# Patient Record
Sex: Male | Born: 1935 | Race: Black or African American | Hispanic: No | Marital: Married | Smoking: Never smoker
Health system: Southern US, Community
[De-identification: ages and names within clinical notes are randomized; demographics above are authoritative.]

## PROBLEM LIST (undated history)

## (undated) DIAGNOSIS — M1990 Unspecified osteoarthritis, unspecified site: Secondary | ICD-10-CM

## (undated) DIAGNOSIS — N189 Chronic kidney disease, unspecified: Secondary | ICD-10-CM

## (undated) HISTORY — PX: EYE SURGERY: SHX253

---

## 2000-07-31 ENCOUNTER — Ambulatory Visit (HOSPITAL_COMMUNITY): Admission: RE | Admit: 2000-07-31 | Discharge: 2000-07-31 | Payer: Self-pay | Admitting: Pulmonary Disease

## 2000-07-31 ENCOUNTER — Encounter: Payer: Self-pay | Admitting: Pulmonary Disease

## 2001-01-12 ENCOUNTER — Encounter: Admission: RE | Admit: 2001-01-12 | Discharge: 2001-02-24 | Payer: Self-pay | Admitting: Neurology

## 2007-03-31 ENCOUNTER — Ambulatory Visit: Admission: RE | Admit: 2007-03-31 | Discharge: 2007-03-31 | Payer: Self-pay | Admitting: Pulmonary Disease

## 2015-07-09 DIAGNOSIS — I119 Hypertensive heart disease without heart failure: Secondary | ICD-10-CM | POA: Diagnosis not present

## 2015-07-09 DIAGNOSIS — M2550 Pain in unspecified joint: Secondary | ICD-10-CM | POA: Diagnosis not present

## 2015-07-09 DIAGNOSIS — M1990 Unspecified osteoarthritis, unspecified site: Secondary | ICD-10-CM | POA: Diagnosis not present

## 2015-07-09 DIAGNOSIS — R803 Bence Jones proteinuria: Secondary | ICD-10-CM | POA: Diagnosis not present

## 2015-08-14 DIAGNOSIS — Z23 Encounter for immunization: Secondary | ICD-10-CM | POA: Diagnosis not present

## 2015-09-05 DIAGNOSIS — M9903 Segmental and somatic dysfunction of lumbar region: Secondary | ICD-10-CM | POA: Diagnosis not present

## 2015-09-05 DIAGNOSIS — M5136 Other intervertebral disc degeneration, lumbar region: Secondary | ICD-10-CM | POA: Diagnosis not present

## 2015-09-05 DIAGNOSIS — M9905 Segmental and somatic dysfunction of pelvic region: Secondary | ICD-10-CM | POA: Diagnosis not present

## 2015-09-05 DIAGNOSIS — M9904 Segmental and somatic dysfunction of sacral region: Secondary | ICD-10-CM | POA: Diagnosis not present

## 2015-10-12 DIAGNOSIS — Z79899 Other long term (current) drug therapy: Secondary | ICD-10-CM | POA: Diagnosis not present

## 2015-10-12 DIAGNOSIS — Z79891 Long term (current) use of opiate analgesic: Secondary | ICD-10-CM | POA: Diagnosis not present

## 2015-11-21 DIAGNOSIS — R809 Proteinuria, unspecified: Secondary | ICD-10-CM | POA: Diagnosis not present

## 2015-11-21 DIAGNOSIS — Z79891 Long term (current) use of opiate analgesic: Secondary | ICD-10-CM | POA: Diagnosis not present

## 2016-01-08 DIAGNOSIS — E7800 Pure hypercholesterolemia, unspecified: Secondary | ICD-10-CM | POA: Diagnosis not present

## 2016-01-08 DIAGNOSIS — D638 Anemia in other chronic diseases classified elsewhere: Secondary | ICD-10-CM | POA: Diagnosis not present

## 2016-01-08 DIAGNOSIS — Z79899 Other long term (current) drug therapy: Secondary | ICD-10-CM | POA: Diagnosis not present

## 2016-01-08 DIAGNOSIS — M2550 Pain in unspecified joint: Secondary | ICD-10-CM | POA: Diagnosis not present

## 2016-01-08 DIAGNOSIS — R972 Elevated prostate specific antigen [PSA]: Secondary | ICD-10-CM | POA: Diagnosis not present

## 2016-01-08 DIAGNOSIS — M159 Polyosteoarthritis, unspecified: Secondary | ICD-10-CM | POA: Diagnosis not present

## 2016-01-08 DIAGNOSIS — R809 Proteinuria, unspecified: Secondary | ICD-10-CM | POA: Diagnosis not present

## 2016-01-08 DIAGNOSIS — Z125 Encounter for screening for malignant neoplasm of prostate: Secondary | ICD-10-CM | POA: Diagnosis not present

## 2016-01-08 DIAGNOSIS — Z79891 Long term (current) use of opiate analgesic: Secondary | ICD-10-CM | POA: Diagnosis not present

## 2016-01-08 DIAGNOSIS — N184 Chronic kidney disease, stage 4 (severe): Secondary | ICD-10-CM | POA: Diagnosis not present

## 2016-01-08 DIAGNOSIS — I119 Hypertensive heart disease without heart failure: Secondary | ICD-10-CM | POA: Diagnosis not present

## 2016-01-23 DIAGNOSIS — M5136 Other intervertebral disc degeneration, lumbar region: Secondary | ICD-10-CM | POA: Diagnosis not present

## 2016-01-23 DIAGNOSIS — M9904 Segmental and somatic dysfunction of sacral region: Secondary | ICD-10-CM | POA: Diagnosis not present

## 2016-01-23 DIAGNOSIS — M9905 Segmental and somatic dysfunction of pelvic region: Secondary | ICD-10-CM | POA: Diagnosis not present

## 2016-01-23 DIAGNOSIS — M9903 Segmental and somatic dysfunction of lumbar region: Secondary | ICD-10-CM | POA: Diagnosis not present

## 2016-02-20 DIAGNOSIS — M9902 Segmental and somatic dysfunction of thoracic region: Secondary | ICD-10-CM | POA: Diagnosis not present

## 2016-02-20 DIAGNOSIS — M5134 Other intervertebral disc degeneration, thoracic region: Secondary | ICD-10-CM | POA: Diagnosis not present

## 2016-02-20 DIAGNOSIS — M25511 Pain in right shoulder: Secondary | ICD-10-CM | POA: Diagnosis not present

## 2016-03-04 DIAGNOSIS — E7800 Pure hypercholesterolemia, unspecified: Secondary | ICD-10-CM | POA: Diagnosis not present

## 2016-03-04 DIAGNOSIS — M1990 Unspecified osteoarthritis, unspecified site: Secondary | ICD-10-CM | POA: Diagnosis not present

## 2016-03-04 DIAGNOSIS — R972 Elevated prostate specific antigen [PSA]: Secondary | ICD-10-CM | POA: Diagnosis not present

## 2016-03-04 DIAGNOSIS — Z79891 Long term (current) use of opiate analgesic: Secondary | ICD-10-CM | POA: Diagnosis not present

## 2016-03-04 DIAGNOSIS — R809 Proteinuria, unspecified: Secondary | ICD-10-CM | POA: Diagnosis not present

## 2016-03-04 DIAGNOSIS — M2550 Pain in unspecified joint: Secondary | ICD-10-CM | POA: Diagnosis not present

## 2016-03-04 DIAGNOSIS — Z79899 Other long term (current) drug therapy: Secondary | ICD-10-CM | POA: Diagnosis not present

## 2016-03-04 DIAGNOSIS — D638 Anemia in other chronic diseases classified elsewhere: Secondary | ICD-10-CM | POA: Diagnosis not present

## 2016-03-04 DIAGNOSIS — I119 Hypertensive heart disease without heart failure: Secondary | ICD-10-CM | POA: Diagnosis not present

## 2016-03-04 DIAGNOSIS — N184 Chronic kidney disease, stage 4 (severe): Secondary | ICD-10-CM | POA: Diagnosis not present

## 2016-03-19 DIAGNOSIS — M9902 Segmental and somatic dysfunction of thoracic region: Secondary | ICD-10-CM | POA: Diagnosis not present

## 2016-03-19 DIAGNOSIS — M5134 Other intervertebral disc degeneration, thoracic region: Secondary | ICD-10-CM | POA: Diagnosis not present

## 2016-03-19 DIAGNOSIS — M25511 Pain in right shoulder: Secondary | ICD-10-CM | POA: Diagnosis not present

## 2016-04-04 DIAGNOSIS — Z79899 Other long term (current) drug therapy: Secondary | ICD-10-CM | POA: Diagnosis not present

## 2016-04-04 DIAGNOSIS — R809 Proteinuria, unspecified: Secondary | ICD-10-CM | POA: Diagnosis not present

## 2016-04-04 DIAGNOSIS — E7800 Pure hypercholesterolemia, unspecified: Secondary | ICD-10-CM | POA: Diagnosis not present

## 2016-04-04 DIAGNOSIS — R972 Elevated prostate specific antigen [PSA]: Secondary | ICD-10-CM | POA: Diagnosis not present

## 2016-04-04 DIAGNOSIS — N184 Chronic kidney disease, stage 4 (severe): Secondary | ICD-10-CM | POA: Diagnosis not present

## 2016-04-04 DIAGNOSIS — Z79891 Long term (current) use of opiate analgesic: Secondary | ICD-10-CM | POA: Diagnosis not present

## 2016-04-04 DIAGNOSIS — D638 Anemia in other chronic diseases classified elsewhere: Secondary | ICD-10-CM | POA: Diagnosis not present

## 2016-04-04 DIAGNOSIS — M2550 Pain in unspecified joint: Secondary | ICD-10-CM | POA: Diagnosis not present

## 2016-04-04 DIAGNOSIS — I119 Hypertensive heart disease without heart failure: Secondary | ICD-10-CM | POA: Diagnosis not present

## 2016-04-04 DIAGNOSIS — M1990 Unspecified osteoarthritis, unspecified site: Secondary | ICD-10-CM | POA: Diagnosis not present

## 2016-04-23 DIAGNOSIS — M9903 Segmental and somatic dysfunction of lumbar region: Secondary | ICD-10-CM | POA: Diagnosis not present

## 2016-04-23 DIAGNOSIS — M9905 Segmental and somatic dysfunction of pelvic region: Secondary | ICD-10-CM | POA: Diagnosis not present

## 2016-04-23 DIAGNOSIS — M9904 Segmental and somatic dysfunction of sacral region: Secondary | ICD-10-CM | POA: Diagnosis not present

## 2016-04-23 DIAGNOSIS — M5136 Other intervertebral disc degeneration, lumbar region: Secondary | ICD-10-CM | POA: Diagnosis not present

## 2016-05-21 DIAGNOSIS — M9904 Segmental and somatic dysfunction of sacral region: Secondary | ICD-10-CM | POA: Diagnosis not present

## 2016-05-21 DIAGNOSIS — M9903 Segmental and somatic dysfunction of lumbar region: Secondary | ICD-10-CM | POA: Diagnosis not present

## 2016-05-21 DIAGNOSIS — M5136 Other intervertebral disc degeneration, lumbar region: Secondary | ICD-10-CM | POA: Diagnosis not present

## 2016-05-21 DIAGNOSIS — M9905 Segmental and somatic dysfunction of pelvic region: Secondary | ICD-10-CM | POA: Diagnosis not present

## 2016-06-06 DIAGNOSIS — Z79891 Long term (current) use of opiate analgesic: Secondary | ICD-10-CM | POA: Diagnosis not present

## 2016-06-06 DIAGNOSIS — Z79899 Other long term (current) drug therapy: Secondary | ICD-10-CM | POA: Diagnosis not present

## 2016-06-06 DIAGNOSIS — M2550 Pain in unspecified joint: Secondary | ICD-10-CM | POA: Diagnosis not present

## 2016-06-06 DIAGNOSIS — N184 Chronic kidney disease, stage 4 (severe): Secondary | ICD-10-CM | POA: Diagnosis not present

## 2016-06-06 DIAGNOSIS — I119 Hypertensive heart disease without heart failure: Secondary | ICD-10-CM | POA: Diagnosis not present

## 2016-06-06 DIAGNOSIS — R809 Proteinuria, unspecified: Secondary | ICD-10-CM | POA: Diagnosis not present

## 2016-06-06 DIAGNOSIS — D638 Anemia in other chronic diseases classified elsewhere: Secondary | ICD-10-CM | POA: Diagnosis not present

## 2016-06-06 DIAGNOSIS — M1990 Unspecified osteoarthritis, unspecified site: Secondary | ICD-10-CM | POA: Diagnosis not present

## 2016-06-06 DIAGNOSIS — E7800 Pure hypercholesterolemia, unspecified: Secondary | ICD-10-CM | POA: Diagnosis not present

## 2016-06-06 DIAGNOSIS — Z5181 Encounter for therapeutic drug level monitoring: Secondary | ICD-10-CM | POA: Diagnosis not present

## 2016-06-06 DIAGNOSIS — R972 Elevated prostate specific antigen [PSA]: Secondary | ICD-10-CM | POA: Diagnosis not present

## 2016-06-19 DIAGNOSIS — M9904 Segmental and somatic dysfunction of sacral region: Secondary | ICD-10-CM | POA: Diagnosis not present

## 2016-06-19 DIAGNOSIS — M9902 Segmental and somatic dysfunction of thoracic region: Secondary | ICD-10-CM | POA: Diagnosis not present

## 2016-06-19 DIAGNOSIS — M5136 Other intervertebral disc degeneration, lumbar region: Secondary | ICD-10-CM | POA: Diagnosis not present

## 2016-06-19 DIAGNOSIS — M9903 Segmental and somatic dysfunction of lumbar region: Secondary | ICD-10-CM | POA: Diagnosis not present

## 2016-07-16 DIAGNOSIS — M9904 Segmental and somatic dysfunction of sacral region: Secondary | ICD-10-CM | POA: Diagnosis not present

## 2016-07-16 DIAGNOSIS — M9902 Segmental and somatic dysfunction of thoracic region: Secondary | ICD-10-CM | POA: Diagnosis not present

## 2016-07-16 DIAGNOSIS — M5136 Other intervertebral disc degeneration, lumbar region: Secondary | ICD-10-CM | POA: Diagnosis not present

## 2016-07-16 DIAGNOSIS — M9903 Segmental and somatic dysfunction of lumbar region: Secondary | ICD-10-CM | POA: Diagnosis not present

## 2016-08-08 DIAGNOSIS — M159 Polyosteoarthritis, unspecified: Secondary | ICD-10-CM | POA: Diagnosis not present

## 2016-08-08 DIAGNOSIS — N184 Chronic kidney disease, stage 4 (severe): Secondary | ICD-10-CM | POA: Diagnosis not present

## 2016-08-08 DIAGNOSIS — M2550 Pain in unspecified joint: Secondary | ICD-10-CM | POA: Diagnosis not present

## 2016-08-08 DIAGNOSIS — I119 Hypertensive heart disease without heart failure: Secondary | ICD-10-CM | POA: Diagnosis not present

## 2016-08-08 DIAGNOSIS — E7800 Pure hypercholesterolemia, unspecified: Secondary | ICD-10-CM | POA: Diagnosis not present

## 2016-08-08 DIAGNOSIS — G8929 Other chronic pain: Secondary | ICD-10-CM | POA: Diagnosis not present

## 2016-08-08 DIAGNOSIS — Z79891 Long term (current) use of opiate analgesic: Secondary | ICD-10-CM | POA: Diagnosis not present

## 2016-08-08 DIAGNOSIS — Z23 Encounter for immunization: Secondary | ICD-10-CM | POA: Diagnosis not present

## 2016-08-08 DIAGNOSIS — R972 Elevated prostate specific antigen [PSA]: Secondary | ICD-10-CM | POA: Diagnosis not present

## 2016-08-08 DIAGNOSIS — D638 Anemia in other chronic diseases classified elsewhere: Secondary | ICD-10-CM | POA: Diagnosis not present

## 2016-08-08 DIAGNOSIS — Z79899 Other long term (current) drug therapy: Secondary | ICD-10-CM | POA: Diagnosis not present

## 2016-08-08 DIAGNOSIS — R809 Proteinuria, unspecified: Secondary | ICD-10-CM | POA: Diagnosis not present

## 2016-08-12 DIAGNOSIS — M9903 Segmental and somatic dysfunction of lumbar region: Secondary | ICD-10-CM | POA: Diagnosis not present

## 2016-08-12 DIAGNOSIS — M5136 Other intervertebral disc degeneration, lumbar region: Secondary | ICD-10-CM | POA: Diagnosis not present

## 2016-09-17 DIAGNOSIS — M5136 Other intervertebral disc degeneration, lumbar region: Secondary | ICD-10-CM | POA: Diagnosis not present

## 2016-09-17 DIAGNOSIS — M9903 Segmental and somatic dysfunction of lumbar region: Secondary | ICD-10-CM | POA: Diagnosis not present

## 2016-10-08 DIAGNOSIS — D638 Anemia in other chronic diseases classified elsewhere: Secondary | ICD-10-CM | POA: Diagnosis not present

## 2016-10-08 DIAGNOSIS — E7800 Pure hypercholesterolemia, unspecified: Secondary | ICD-10-CM | POA: Diagnosis not present

## 2016-10-08 DIAGNOSIS — R972 Elevated prostate specific antigen [PSA]: Secondary | ICD-10-CM | POA: Diagnosis not present

## 2016-10-08 DIAGNOSIS — M2550 Pain in unspecified joint: Secondary | ICD-10-CM | POA: Diagnosis not present

## 2016-10-08 DIAGNOSIS — Z79899 Other long term (current) drug therapy: Secondary | ICD-10-CM | POA: Diagnosis not present

## 2016-10-08 DIAGNOSIS — I119 Hypertensive heart disease without heart failure: Secondary | ICD-10-CM | POA: Diagnosis not present

## 2016-10-08 DIAGNOSIS — N184 Chronic kidney disease, stage 4 (severe): Secondary | ICD-10-CM | POA: Diagnosis not present

## 2016-10-08 DIAGNOSIS — Z79891 Long term (current) use of opiate analgesic: Secondary | ICD-10-CM | POA: Diagnosis not present

## 2016-10-08 DIAGNOSIS — R809 Proteinuria, unspecified: Secondary | ICD-10-CM | POA: Diagnosis not present

## 2016-10-08 DIAGNOSIS — M159 Polyosteoarthritis, unspecified: Secondary | ICD-10-CM | POA: Diagnosis not present

## 2016-10-15 DIAGNOSIS — M5136 Other intervertebral disc degeneration, lumbar region: Secondary | ICD-10-CM | POA: Diagnosis not present

## 2016-10-15 DIAGNOSIS — M9903 Segmental and somatic dysfunction of lumbar region: Secondary | ICD-10-CM | POA: Diagnosis not present

## 2016-11-15 DIAGNOSIS — M9904 Segmental and somatic dysfunction of sacral region: Secondary | ICD-10-CM | POA: Diagnosis not present

## 2016-11-15 DIAGNOSIS — M9903 Segmental and somatic dysfunction of lumbar region: Secondary | ICD-10-CM | POA: Diagnosis not present

## 2016-11-15 DIAGNOSIS — M5136 Other intervertebral disc degeneration, lumbar region: Secondary | ICD-10-CM | POA: Diagnosis not present

## 2016-11-15 DIAGNOSIS — M9905 Segmental and somatic dysfunction of pelvic region: Secondary | ICD-10-CM | POA: Diagnosis not present

## 2016-12-03 DIAGNOSIS — M2550 Pain in unspecified joint: Secondary | ICD-10-CM | POA: Diagnosis not present

## 2016-12-03 DIAGNOSIS — M1990 Unspecified osteoarthritis, unspecified site: Secondary | ICD-10-CM | POA: Diagnosis not present

## 2016-12-03 DIAGNOSIS — Z79891 Long term (current) use of opiate analgesic: Secondary | ICD-10-CM | POA: Diagnosis not present

## 2016-12-03 DIAGNOSIS — Z79899 Other long term (current) drug therapy: Secondary | ICD-10-CM | POA: Diagnosis not present

## 2016-12-03 DIAGNOSIS — R972 Elevated prostate specific antigen [PSA]: Secondary | ICD-10-CM | POA: Diagnosis not present

## 2016-12-03 DIAGNOSIS — R809 Proteinuria, unspecified: Secondary | ICD-10-CM | POA: Diagnosis not present

## 2016-12-03 DIAGNOSIS — N184 Chronic kidney disease, stage 4 (severe): Secondary | ICD-10-CM | POA: Diagnosis not present

## 2016-12-03 DIAGNOSIS — I2510 Atherosclerotic heart disease of native coronary artery without angina pectoris: Secondary | ICD-10-CM | POA: Diagnosis not present

## 2016-12-03 DIAGNOSIS — E7800 Pure hypercholesterolemia, unspecified: Secondary | ICD-10-CM | POA: Diagnosis not present

## 2016-12-03 DIAGNOSIS — I119 Hypertensive heart disease without heart failure: Secondary | ICD-10-CM | POA: Diagnosis not present

## 2016-12-03 DIAGNOSIS — M545 Low back pain: Secondary | ICD-10-CM | POA: Diagnosis not present

## 2016-12-05 ENCOUNTER — Other Ambulatory Visit (HOSPITAL_COMMUNITY): Payer: Self-pay | Admitting: Pulmonary Disease

## 2016-12-05 ENCOUNTER — Ambulatory Visit (HOSPITAL_COMMUNITY)
Admission: RE | Admit: 2016-12-05 | Discharge: 2016-12-05 | Disposition: A | Discharge status: Home / Self Care | Payer: Medicare Other | Source: Ambulatory Visit | Attending: Pulmonary Disease | Admitting: Pulmonary Disease

## 2016-12-05 DIAGNOSIS — M4316 Spondylolisthesis, lumbar region: Secondary | ICD-10-CM | POA: Diagnosis not present

## 2016-12-05 DIAGNOSIS — M545 Low back pain: Secondary | ICD-10-CM

## 2016-12-05 DIAGNOSIS — M5136 Other intervertebral disc degeneration, lumbar region: Secondary | ICD-10-CM | POA: Insufficient documentation

## 2016-12-17 DIAGNOSIS — M9903 Segmental and somatic dysfunction of lumbar region: Secondary | ICD-10-CM | POA: Diagnosis not present

## 2016-12-17 DIAGNOSIS — M5136 Other intervertebral disc degeneration, lumbar region: Secondary | ICD-10-CM | POA: Diagnosis not present

## 2016-12-17 DIAGNOSIS — M9905 Segmental and somatic dysfunction of pelvic region: Secondary | ICD-10-CM | POA: Diagnosis not present

## 2016-12-17 DIAGNOSIS — M9904 Segmental and somatic dysfunction of sacral region: Secondary | ICD-10-CM | POA: Diagnosis not present

## 2017-01-14 DIAGNOSIS — R972 Elevated prostate specific antigen [PSA]: Secondary | ICD-10-CM | POA: Diagnosis not present

## 2017-01-14 DIAGNOSIS — Z79899 Other long term (current) drug therapy: Secondary | ICD-10-CM | POA: Diagnosis not present

## 2017-01-14 DIAGNOSIS — R809 Proteinuria, unspecified: Secondary | ICD-10-CM | POA: Diagnosis not present

## 2017-01-14 DIAGNOSIS — N184 Chronic kidney disease, stage 4 (severe): Secondary | ICD-10-CM | POA: Diagnosis not present

## 2017-01-14 DIAGNOSIS — M158 Other polyosteoarthritis: Secondary | ICD-10-CM | POA: Diagnosis not present

## 2017-01-14 DIAGNOSIS — I119 Hypertensive heart disease without heart failure: Secondary | ICD-10-CM | POA: Diagnosis not present

## 2017-01-14 DIAGNOSIS — M545 Low back pain: Secondary | ICD-10-CM | POA: Diagnosis not present

## 2017-01-14 DIAGNOSIS — D638 Anemia in other chronic diseases classified elsewhere: Secondary | ICD-10-CM | POA: Diagnosis not present

## 2017-01-14 DIAGNOSIS — E7800 Pure hypercholesterolemia, unspecified: Secondary | ICD-10-CM | POA: Diagnosis not present

## 2017-01-14 DIAGNOSIS — M2550 Pain in unspecified joint: Secondary | ICD-10-CM | POA: Diagnosis not present

## 2017-01-14 DIAGNOSIS — G8929 Other chronic pain: Secondary | ICD-10-CM | POA: Diagnosis not present

## 2017-01-14 DIAGNOSIS — Z79891 Long term (current) use of opiate analgesic: Secondary | ICD-10-CM | POA: Diagnosis not present

## 2017-01-15 DIAGNOSIS — M5136 Other intervertebral disc degeneration, lumbar region: Secondary | ICD-10-CM | POA: Diagnosis not present

## 2017-01-15 DIAGNOSIS — M9903 Segmental and somatic dysfunction of lumbar region: Secondary | ICD-10-CM | POA: Diagnosis not present

## 2017-01-15 DIAGNOSIS — M9904 Segmental and somatic dysfunction of sacral region: Secondary | ICD-10-CM | POA: Diagnosis not present

## 2017-01-15 DIAGNOSIS — M9905 Segmental and somatic dysfunction of pelvic region: Secondary | ICD-10-CM | POA: Diagnosis not present

## 2017-02-11 DIAGNOSIS — M9905 Segmental and somatic dysfunction of pelvic region: Secondary | ICD-10-CM | POA: Diagnosis not present

## 2017-02-11 DIAGNOSIS — M9904 Segmental and somatic dysfunction of sacral region: Secondary | ICD-10-CM | POA: Diagnosis not present

## 2017-02-11 DIAGNOSIS — M5136 Other intervertebral disc degeneration, lumbar region: Secondary | ICD-10-CM | POA: Diagnosis not present

## 2017-02-11 DIAGNOSIS — M9903 Segmental and somatic dysfunction of lumbar region: Secondary | ICD-10-CM | POA: Diagnosis not present

## 2017-03-11 DIAGNOSIS — M9903 Segmental and somatic dysfunction of lumbar region: Secondary | ICD-10-CM | POA: Diagnosis not present

## 2017-03-11 DIAGNOSIS — M9905 Segmental and somatic dysfunction of pelvic region: Secondary | ICD-10-CM | POA: Diagnosis not present

## 2017-03-11 DIAGNOSIS — M9904 Segmental and somatic dysfunction of sacral region: Secondary | ICD-10-CM | POA: Diagnosis not present

## 2017-03-11 DIAGNOSIS — M5136 Other intervertebral disc degeneration, lumbar region: Secondary | ICD-10-CM | POA: Diagnosis not present

## 2017-03-20 DIAGNOSIS — E7800 Pure hypercholesterolemia, unspecified: Secondary | ICD-10-CM | POA: Diagnosis not present

## 2017-03-20 DIAGNOSIS — N184 Chronic kidney disease, stage 4 (severe): Secondary | ICD-10-CM | POA: Diagnosis not present

## 2017-03-20 DIAGNOSIS — I119 Hypertensive heart disease without heart failure: Secondary | ICD-10-CM | POA: Diagnosis not present

## 2017-03-20 DIAGNOSIS — D638 Anemia in other chronic diseases classified elsewhere: Secondary | ICD-10-CM | POA: Diagnosis not present

## 2017-03-20 DIAGNOSIS — R972 Elevated prostate specific antigen [PSA]: Secondary | ICD-10-CM | POA: Diagnosis not present

## 2017-03-20 DIAGNOSIS — Z79891 Long term (current) use of opiate analgesic: Secondary | ICD-10-CM | POA: Diagnosis not present

## 2017-03-20 DIAGNOSIS — M159 Polyosteoarthritis, unspecified: Secondary | ICD-10-CM | POA: Diagnosis not present

## 2017-03-20 DIAGNOSIS — Z79899 Other long term (current) drug therapy: Secondary | ICD-10-CM | POA: Diagnosis not present

## 2017-03-20 DIAGNOSIS — M545 Low back pain: Secondary | ICD-10-CM | POA: Diagnosis not present

## 2017-03-20 DIAGNOSIS — R809 Proteinuria, unspecified: Secondary | ICD-10-CM | POA: Diagnosis not present

## 2017-03-20 DIAGNOSIS — M2550 Pain in unspecified joint: Secondary | ICD-10-CM | POA: Diagnosis not present

## 2017-04-15 DIAGNOSIS — M9903 Segmental and somatic dysfunction of lumbar region: Secondary | ICD-10-CM | POA: Diagnosis not present

## 2017-04-15 DIAGNOSIS — M9905 Segmental and somatic dysfunction of pelvic region: Secondary | ICD-10-CM | POA: Diagnosis not present

## 2017-04-15 DIAGNOSIS — M9904 Segmental and somatic dysfunction of sacral region: Secondary | ICD-10-CM | POA: Diagnosis not present

## 2017-04-15 DIAGNOSIS — M5136 Other intervertebral disc degeneration, lumbar region: Secondary | ICD-10-CM | POA: Diagnosis not present

## 2017-05-13 DIAGNOSIS — M9904 Segmental and somatic dysfunction of sacral region: Secondary | ICD-10-CM | POA: Diagnosis not present

## 2017-05-13 DIAGNOSIS — M9903 Segmental and somatic dysfunction of lumbar region: Secondary | ICD-10-CM | POA: Diagnosis not present

## 2017-05-13 DIAGNOSIS — M5136 Other intervertebral disc degeneration, lumbar region: Secondary | ICD-10-CM | POA: Diagnosis not present

## 2017-05-13 DIAGNOSIS — M9905 Segmental and somatic dysfunction of pelvic region: Secondary | ICD-10-CM | POA: Diagnosis not present

## 2017-05-22 DIAGNOSIS — M545 Low back pain: Secondary | ICD-10-CM | POA: Diagnosis not present

## 2017-05-22 DIAGNOSIS — Z125 Encounter for screening for malignant neoplasm of prostate: Secondary | ICD-10-CM | POA: Diagnosis not present

## 2017-05-22 DIAGNOSIS — N184 Chronic kidney disease, stage 4 (severe): Secondary | ICD-10-CM | POA: Diagnosis not present

## 2017-05-22 DIAGNOSIS — M2550 Pain in unspecified joint: Secondary | ICD-10-CM | POA: Diagnosis not present

## 2017-05-22 DIAGNOSIS — E7800 Pure hypercholesterolemia, unspecified: Secondary | ICD-10-CM | POA: Diagnosis not present

## 2017-05-22 DIAGNOSIS — I119 Hypertensive heart disease without heart failure: Secondary | ICD-10-CM | POA: Diagnosis not present

## 2017-05-22 DIAGNOSIS — M455 Ankylosing spondylitis of thoracolumbar region: Secondary | ICD-10-CM | POA: Diagnosis not present

## 2017-05-22 DIAGNOSIS — R972 Elevated prostate specific antigen [PSA]: Secondary | ICD-10-CM | POA: Diagnosis not present

## 2017-05-22 DIAGNOSIS — Z79899 Other long term (current) drug therapy: Secondary | ICD-10-CM | POA: Diagnosis not present

## 2017-05-22 DIAGNOSIS — D638 Anemia in other chronic diseases classified elsewhere: Secondary | ICD-10-CM | POA: Diagnosis not present

## 2017-05-22 DIAGNOSIS — R809 Proteinuria, unspecified: Secondary | ICD-10-CM | POA: Diagnosis not present

## 2017-05-22 DIAGNOSIS — Z79891 Long term (current) use of opiate analgesic: Secondary | ICD-10-CM | POA: Diagnosis not present

## 2017-05-22 DIAGNOSIS — M158 Other polyosteoarthritis: Secondary | ICD-10-CM | POA: Diagnosis not present

## 2017-06-17 DIAGNOSIS — M5136 Other intervertebral disc degeneration, lumbar region: Secondary | ICD-10-CM | POA: Diagnosis not present

## 2017-06-17 DIAGNOSIS — M9903 Segmental and somatic dysfunction of lumbar region: Secondary | ICD-10-CM | POA: Diagnosis not present

## 2017-06-17 DIAGNOSIS — M9904 Segmental and somatic dysfunction of sacral region: Secondary | ICD-10-CM | POA: Diagnosis not present

## 2017-06-17 DIAGNOSIS — M9905 Segmental and somatic dysfunction of pelvic region: Secondary | ICD-10-CM | POA: Diagnosis not present

## 2017-07-22 DIAGNOSIS — M9904 Segmental and somatic dysfunction of sacral region: Secondary | ICD-10-CM | POA: Diagnosis not present

## 2017-07-22 DIAGNOSIS — M9903 Segmental and somatic dysfunction of lumbar region: Secondary | ICD-10-CM | POA: Diagnosis not present

## 2017-07-22 DIAGNOSIS — M9905 Segmental and somatic dysfunction of pelvic region: Secondary | ICD-10-CM | POA: Diagnosis not present

## 2017-07-22 DIAGNOSIS — M5136 Other intervertebral disc degeneration, lumbar region: Secondary | ICD-10-CM | POA: Diagnosis not present

## 2017-07-24 DIAGNOSIS — Z79891 Long term (current) use of opiate analgesic: Secondary | ICD-10-CM | POA: Diagnosis not present

## 2017-07-24 DIAGNOSIS — N184 Chronic kidney disease, stage 4 (severe): Secondary | ICD-10-CM | POA: Diagnosis not present

## 2017-07-24 DIAGNOSIS — D638 Anemia in other chronic diseases classified elsewhere: Secondary | ICD-10-CM | POA: Diagnosis not present

## 2017-07-24 DIAGNOSIS — M545 Low back pain: Secondary | ICD-10-CM | POA: Diagnosis not present

## 2017-07-24 DIAGNOSIS — R972 Elevated prostate specific antigen [PSA]: Secondary | ICD-10-CM | POA: Diagnosis not present

## 2017-07-24 DIAGNOSIS — Z23 Encounter for immunization: Secondary | ICD-10-CM | POA: Diagnosis not present

## 2017-07-24 DIAGNOSIS — M2550 Pain in unspecified joint: Secondary | ICD-10-CM | POA: Diagnosis not present

## 2017-07-24 DIAGNOSIS — E7800 Pure hypercholesterolemia, unspecified: Secondary | ICD-10-CM | POA: Diagnosis not present

## 2017-07-24 DIAGNOSIS — M159 Polyosteoarthritis, unspecified: Secondary | ICD-10-CM | POA: Diagnosis not present

## 2017-07-24 DIAGNOSIS — Z79899 Other long term (current) drug therapy: Secondary | ICD-10-CM | POA: Diagnosis not present

## 2017-07-24 DIAGNOSIS — R809 Proteinuria, unspecified: Secondary | ICD-10-CM | POA: Diagnosis not present

## 2017-07-24 DIAGNOSIS — I119 Hypertensive heart disease without heart failure: Secondary | ICD-10-CM | POA: Diagnosis not present

## 2017-08-20 DIAGNOSIS — M9905 Segmental and somatic dysfunction of pelvic region: Secondary | ICD-10-CM | POA: Diagnosis not present

## 2017-08-20 DIAGNOSIS — M9903 Segmental and somatic dysfunction of lumbar region: Secondary | ICD-10-CM | POA: Diagnosis not present

## 2017-08-20 DIAGNOSIS — M5136 Other intervertebral disc degeneration, lumbar region: Secondary | ICD-10-CM | POA: Diagnosis not present

## 2017-08-20 DIAGNOSIS — M9904 Segmental and somatic dysfunction of sacral region: Secondary | ICD-10-CM | POA: Diagnosis not present

## 2017-09-11 DIAGNOSIS — N184 Chronic kidney disease, stage 4 (severe): Secondary | ICD-10-CM | POA: Diagnosis not present

## 2017-09-11 DIAGNOSIS — E7800 Pure hypercholesterolemia, unspecified: Secondary | ICD-10-CM | POA: Diagnosis not present

## 2017-09-11 DIAGNOSIS — M545 Low back pain: Secondary | ICD-10-CM | POA: Diagnosis not present

## 2017-09-11 DIAGNOSIS — Z79891 Long term (current) use of opiate analgesic: Secondary | ICD-10-CM | POA: Diagnosis not present

## 2017-09-11 DIAGNOSIS — Z79899 Other long term (current) drug therapy: Secondary | ICD-10-CM | POA: Diagnosis not present

## 2017-09-11 DIAGNOSIS — R972 Elevated prostate specific antigen [PSA]: Secondary | ICD-10-CM | POA: Diagnosis not present

## 2017-09-11 DIAGNOSIS — D638 Anemia in other chronic diseases classified elsewhere: Secondary | ICD-10-CM | POA: Diagnosis not present

## 2017-09-11 DIAGNOSIS — M130 Polyarthritis, unspecified: Secondary | ICD-10-CM | POA: Diagnosis not present

## 2017-09-11 DIAGNOSIS — M2550 Pain in unspecified joint: Secondary | ICD-10-CM | POA: Diagnosis not present

## 2017-09-11 DIAGNOSIS — R809 Proteinuria, unspecified: Secondary | ICD-10-CM | POA: Diagnosis not present

## 2017-09-11 DIAGNOSIS — I119 Hypertensive heart disease without heart failure: Secondary | ICD-10-CM | POA: Diagnosis not present

## 2017-09-16 DIAGNOSIS — M9903 Segmental and somatic dysfunction of lumbar region: Secondary | ICD-10-CM | POA: Diagnosis not present

## 2017-09-16 DIAGNOSIS — M5136 Other intervertebral disc degeneration, lumbar region: Secondary | ICD-10-CM | POA: Diagnosis not present

## 2017-09-16 DIAGNOSIS — M9905 Segmental and somatic dysfunction of pelvic region: Secondary | ICD-10-CM | POA: Diagnosis not present

## 2017-09-16 DIAGNOSIS — M9904 Segmental and somatic dysfunction of sacral region: Secondary | ICD-10-CM | POA: Diagnosis not present

## 2017-11-20 DIAGNOSIS — I119 Hypertensive heart disease without heart failure: Secondary | ICD-10-CM | POA: Diagnosis not present

## 2017-11-20 DIAGNOSIS — M5440 Lumbago with sciatica, unspecified side: Secondary | ICD-10-CM | POA: Diagnosis not present

## 2017-11-20 DIAGNOSIS — Z79891 Long term (current) use of opiate analgesic: Secondary | ICD-10-CM | POA: Diagnosis not present

## 2017-11-20 DIAGNOSIS — Z79899 Other long term (current) drug therapy: Secondary | ICD-10-CM | POA: Diagnosis not present

## 2017-11-20 DIAGNOSIS — M2550 Pain in unspecified joint: Secondary | ICD-10-CM | POA: Diagnosis not present

## 2017-11-20 DIAGNOSIS — N184 Chronic kidney disease, stage 4 (severe): Secondary | ICD-10-CM | POA: Diagnosis not present

## 2017-11-20 DIAGNOSIS — E7800 Pure hypercholesterolemia, unspecified: Secondary | ICD-10-CM | POA: Diagnosis not present

## 2017-11-20 DIAGNOSIS — R972 Elevated prostate specific antigen [PSA]: Secondary | ICD-10-CM | POA: Diagnosis not present

## 2017-11-20 DIAGNOSIS — E790 Hyperuricemia without signs of inflammatory arthritis and tophaceous disease: Secondary | ICD-10-CM | POA: Diagnosis not present

## 2017-11-20 DIAGNOSIS — R809 Proteinuria, unspecified: Secondary | ICD-10-CM | POA: Diagnosis not present

## 2017-11-20 DIAGNOSIS — D638 Anemia in other chronic diseases classified elsewhere: Secondary | ICD-10-CM | POA: Diagnosis not present

## 2017-11-20 DIAGNOSIS — M159 Polyosteoarthritis, unspecified: Secondary | ICD-10-CM | POA: Diagnosis not present

## 2018-09-18 ENCOUNTER — Encounter (HOSPITAL_COMMUNITY): Payer: Self-pay | Admitting: Emergency Medicine

## 2018-09-18 ENCOUNTER — Inpatient Hospital Stay (HOSPITAL_COMMUNITY)
Admission: EM | Admit: 2018-09-18 | Discharge: 2018-09-23 | DRG: 377 | Disposition: A | Discharge status: Hospice | Payer: Medicare Other | Attending: Internal Medicine | Admitting: Internal Medicine

## 2018-09-18 ENCOUNTER — Other Ambulatory Visit: Payer: Self-pay

## 2018-09-18 ENCOUNTER — Emergency Department (HOSPITAL_COMMUNITY): Payer: Medicare Other

## 2018-09-18 DIAGNOSIS — I120 Hypertensive chronic kidney disease with stage 5 chronic kidney disease or end stage renal disease: Secondary | ICD-10-CM | POA: Diagnosis present

## 2018-09-18 DIAGNOSIS — K5731 Diverticulosis of large intestine without perforation or abscess with bleeding: Secondary | ICD-10-CM | POA: Diagnosis present

## 2018-09-18 DIAGNOSIS — N179 Acute kidney failure, unspecified: Secondary | ICD-10-CM

## 2018-09-18 DIAGNOSIS — D62 Acute posthemorrhagic anemia: Secondary | ICD-10-CM | POA: Diagnosis present

## 2018-09-18 DIAGNOSIS — N189 Chronic kidney disease, unspecified: Secondary | ICD-10-CM

## 2018-09-18 DIAGNOSIS — F419 Anxiety disorder, unspecified: Secondary | ICD-10-CM | POA: Diagnosis present

## 2018-09-18 DIAGNOSIS — M109 Gout, unspecified: Secondary | ICD-10-CM | POA: Diagnosis present

## 2018-09-18 DIAGNOSIS — Z515 Encounter for palliative care: Secondary | ICD-10-CM | POA: Diagnosis present

## 2018-09-18 DIAGNOSIS — I10 Essential (primary) hypertension: Secondary | ICD-10-CM | POA: Diagnosis not present

## 2018-09-18 DIAGNOSIS — Z66 Do not resuscitate: Secondary | ICD-10-CM | POA: Diagnosis present

## 2018-09-18 DIAGNOSIS — F0390 Unspecified dementia without behavioral disturbance: Secondary | ICD-10-CM | POA: Diagnosis present

## 2018-09-18 DIAGNOSIS — K921 Melena: Secondary | ICD-10-CM | POA: Diagnosis present

## 2018-09-18 DIAGNOSIS — E785 Hyperlipidemia, unspecified: Secondary | ICD-10-CM | POA: Diagnosis present

## 2018-09-18 DIAGNOSIS — D649 Anemia, unspecified: Secondary | ICD-10-CM

## 2018-09-18 DIAGNOSIS — D500 Iron deficiency anemia secondary to blood loss (chronic): Secondary | ICD-10-CM | POA: Diagnosis not present

## 2018-09-18 DIAGNOSIS — H9190 Unspecified hearing loss, unspecified ear: Secondary | ICD-10-CM | POA: Diagnosis present

## 2018-09-18 DIAGNOSIS — K922 Gastrointestinal hemorrhage, unspecified: Secondary | ICD-10-CM | POA: Diagnosis present

## 2018-09-18 DIAGNOSIS — N186 End stage renal disease: Secondary | ICD-10-CM | POA: Diagnosis not present

## 2018-09-18 HISTORY — DX: Chronic kidney disease, unspecified: N18.9

## 2018-09-18 HISTORY — DX: Unspecified osteoarthritis, unspecified site: M19.90

## 2018-09-18 LAB — COMPREHENSIVE METABOLIC PANEL
ALT: 15 U/L (ref 0–44)
ANION GAP: 17 — AB (ref 5–15)
AST: 29 U/L (ref 15–41)
Albumin: 3.2 g/dL — ABNORMAL LOW (ref 3.5–5.0)
Alkaline Phosphatase: 22 U/L — ABNORMAL LOW (ref 38–126)
BILIRUBIN TOTAL: 0.5 mg/dL (ref 0.3–1.2)
BUN: 136 mg/dL — AB (ref 8–23)
CHLORIDE: 114 mmol/L — AB (ref 98–111)
CO2: 10 mmol/L — ABNORMAL LOW (ref 22–32)
Calcium: 8.8 mg/dL — ABNORMAL LOW (ref 8.9–10.3)
Creatinine, Ser: 8.36 mg/dL — ABNORMAL HIGH (ref 0.61–1.24)
GFR calc Af Amer: 6 mL/min — ABNORMAL LOW (ref >60)
GFR, EST NON AFRICAN AMERICAN: 5 mL/min — AB (ref >60)
Glucose, Bld: 160 mg/dL — ABNORMAL HIGH (ref 70–99)
POTASSIUM: 4.9 mmol/L (ref 3.5–5.1)
Sodium: 141 mmol/L (ref 135–145)
TOTAL PROTEIN: 5.8 g/dL — AB (ref 6.5–8.1)

## 2018-09-18 LAB — CBC WITH DIFFERENTIAL/PLATELET
ABS IMMATURE GRANULOCYTES: 0.04 10*3/uL (ref 0.00–0.07)
BASOS ABS: 0 10*3/uL (ref 0.0–0.1)
BASOS PCT: 0 %
Eosinophils Absolute: 0.2 10*3/uL (ref 0.0–0.5)
Eosinophils Relative: 2 %
HCT: 20.2 % — ABNORMAL LOW (ref 39.0–52.0)
HEMOGLOBIN: 5.9 g/dL — AB (ref 13.0–17.0)
IMMATURE GRANULOCYTES: 1 %
LYMPHS PCT: 7 %
Lymphs Abs: 0.6 10*3/uL — ABNORMAL LOW (ref 0.7–4.0)
MCH: 26.3 pg (ref 26.0–34.0)
MCHC: 29.2 g/dL — ABNORMAL LOW (ref 30.0–36.0)
MCV: 90.2 fL (ref 80.0–100.0)
Monocytes Absolute: 0.6 10*3/uL (ref 0.1–1.0)
Monocytes Relative: 7 %
NEUTROS ABS: 7.2 10*3/uL (ref 1.7–7.7)
NEUTROS PCT: 83 %
NRBC: 0.5 % — AB (ref 0.0–0.2)
PLATELETS: 143 10*3/uL — AB (ref 150–400)
RBC: 2.24 MIL/uL — AB (ref 4.22–5.81)
RDW: 18 % — ABNORMAL HIGH (ref 11.5–15.5)
WBC: 8.5 10*3/uL (ref 4.0–10.5)

## 2018-09-18 LAB — HEMOGLOBIN: Hemoglobin: 6.8 g/dL — CL (ref 13.0–17.0)

## 2018-09-18 LAB — PROTIME-INR
INR: 1.4
PROTHROMBIN TIME: 17 s — AB (ref 11.4–15.2)

## 2018-09-18 LAB — POC OCCULT BLOOD, ED: FECAL OCCULT BLD: POSITIVE — AB

## 2018-09-18 LAB — PREPARE RBC (CROSSMATCH)

## 2018-09-18 LAB — ABO/RH: ABO/RH(D): O NEG

## 2018-09-18 MED ORDER — SODIUM CHLORIDE 0.9 % IV BOLUS
500.0000 mL | Freq: Once | INTRAVENOUS | Status: AC
  Administered 2018-09-18: 500 mL via INTRAVENOUS

## 2018-09-18 MED ORDER — PANTOPRAZOLE SODIUM 40 MG IV SOLR
40.0000 mg | Freq: Once | INTRAVENOUS | Status: AC
  Administered 2018-09-18: 40 mg via INTRAVENOUS
  Filled 2018-09-18: qty 40

## 2018-09-18 MED ORDER — VITAMIN K1 1 MG/0.5ML IJ SOLN
1.0000 mg | INTRAMUSCULAR | Status: AC
  Administered 2018-09-19: 1 mg via SUBCUTANEOUS
  Filled 2018-09-18: qty 0.1
  Filled 2018-09-18: qty 0.5
  Filled 2018-09-18: qty 0.1

## 2018-09-18 MED ORDER — SODIUM CHLORIDE 0.9 % IV SOLN
10.0000 mL/h | Freq: Once | INTRAVENOUS | Status: AC
  Administered 2018-09-18: 10 mL/h via INTRAVENOUS

## 2018-09-18 MED ORDER — SODIUM CHLORIDE 0.9 % IV SOLN
INTRAVENOUS | Status: DC
  Administered 2018-09-18 – 2018-09-20 (×3): via INTRAVENOUS

## 2018-09-18 MED ORDER — ONDANSETRON HCL 4 MG PO TABS: 4.0000 mg | ORAL_TABLET | Freq: Four times a day (QID) | ORAL | Status: DC | PRN

## 2018-09-18 MED ORDER — ZOLPIDEM TARTRATE 5 MG PO TABS: 5.0000 mg | ORAL_TABLET | Freq: Every evening | ORAL | Status: DC | PRN

## 2018-09-18 MED ORDER — ONDANSETRON HCL 4 MG/2ML IJ SOLN: 4.0000 mg | Freq: Four times a day (QID) | INTRAMUSCULAR | Status: DC | PRN

## 2018-09-18 NOTE — Progress Notes (Signed)
Report received. Bed ready.  

## 2018-09-18 NOTE — ED Notes (Signed)
Patient returned to room from CT scan.

## 2018-09-18 NOTE — ED Notes (Signed)
Patient tolerating blood transfusion well and denies signs of transfusion reaction. No complaints at this time. Vital signs stable at this time. Will continue to monitor. Call light given to patient.

## 2018-09-18 NOTE — ED Notes (Signed)
Please call daughter Blenda Peals with updates [phone]

## 2018-09-18 NOTE — H&P (Signed)
[facility]                                                                                    Patient Demographics  Aaron Ward, is a 84 y.o. male  CSN: [id number]  MRN: [id number]  DOB - [birth year]  Admit Date - 09/18/2018  Outpatient Primary MD for the patient is Corine Shelter, MD   With History of -  History reviewed. No pertinent past medical history.    History reviewed. No pertinent surgical history.  in for   Chief Complaint  Patient presents with  . GI Bleeding  . Loss of Consciousness     HPI  Aaron Ward  is a 84 y.o. male, with past medical history significant for hypertension and recent diagnosis of chronic kidney disease presenting to the emergency room with 2 days history of acute dark red blood per rectum.  Patient had multiple episodes.  Today he started feeling lightheaded and he called EMS.  His blood pressure on arrival was in the 70s systolic.  Patient received IV fluids and this improved.  No history of anticoagulation however his INR was low at 4.  No previous history of GI bleed, colonoscopy or endoscopy.  She denies any chest pains, shortness of breath abdominal pain or diarrhea.  His primary medical doctor informed him about acute renal failure noted on his blood work last week and he refused hemodialysis.    Review of Systems    In addition to the HPI above,  No Fever-chills, No Headache, No changes with Vision or hearing, No problems swallowing food or Liquids, No Chest pain, Cough or Shortness of Breath, No Abdominal pain, No Nausea or Vommitting, Bowel movements are regular, No dysuria, No new skin rashes or bruises, No new joints pains-aches,  No new weakness, tingling, numbness in any extremity, No recent weight gain or loss, No polyuria, polydypsia or polyphagia, No significant Mental Stressors.  A full 10 point Review of Systems was done, except as stated above, all other Review of Systems were  negative.   Social History Social History   Tobacco Use  . Smoking status: Never Smoker  . Smokeless tobacco: Never Used  Substance Use Topics  . Alcohol use: Not on file     Family History History reviewed. No pertinent family history.   Prior to Admission medications   Not on File    No Known Allergies  Physical Exam  Vitals  Blood pressure (!) 167/110, pulse (!) 102, temperature 98 F (36.7 C), temperature source Oral, resp. rate (!) 22, SpO2 100 %.   1. General elderly male, well-developed, extremely pleasant  2. Normal affect and insight, Not Suicidal or Homicidal, Awake Alert, Oriented X 3.  3. No F.N deficits, grossly, patient moving all extremities  4. Ears and Eyes appear Normal, Conjunctivae as she, PERRLA. Moist Oral Mucosa.  5. Supple Neck, No JVD, No cervical lymphadenopathy appriciated, No Carotid Bruits.  6. Symmetrical Chest wall movement, Good air movement bilaterally, CTAB.  7. RRR, No Gallops, Rubs or Murmurs, No Parasternal Heave.  8. Positive Bowel Sounds, Abdomen Soft, Non tender, .  9.  No Cyanosis, Normal Skin Turgor,  No Skin Rash or Bruise.  10. Good muscle tone,  joints appear normal , no effusions, Normal ROM.    Data Review  CBC Recent Labs  Lab 09/18/18 1531  WBC 8.5  HGB 5.9*  HCT 20.2*  PLT 143*  MCV 90.2  MCH 26.3  MCHC 29.2*  RDW 18.0*  LYMPHSABS 0.6*  MONOABS 0.6  EOSABS 0.2  BASOSABS 0.0   ------------------------------------------------------------------------------------------------------------------  Chemistries  Recent Labs  Lab 09/18/18 1531  NA 141  K 4.9  CL 114*  CO2 10*  GLUCOSE 160*  BUN 136*  CREATININE 8.36*  CALCIUM 8.8*  AST 29  ALT 15  ALKPHOS 22*  BILITOT 0.5   ------------------------------------------------------------------------------------------------------------------ CrCl cannot be calculated (Unknown ideal  weight.). ------------------------------------------------------------------------------------------------------------------ No results for input(s): TSH, T4TOTAL, T3FREE, THYROIDAB in the last 72 hours.  Invalid input(s): FREET3   Coagulation profile Recent Labs  Lab 09/18/18 1531  INR 1.40   ------------------------------------------------------------------------------------------------------------------- No results for input(s): DDIMER in the last 72 hours. -------------------------------------------------------------------------------------------------------------------  Cardiac Enzymes No results for input(s): CKMB, TROPONINI, MYOGLOBIN in the last 168 hours.  Invalid input(s): CK ------------------------------------------------------------------------------------------------------------------ Invalid input(s): POCBNP   ---------------------------------------------------------------------------------------------------------------  Urinalysis No results found for: COLORURINE, APPEARANCEUR, LABSPEC, PHURINE, GLUCOSEU, HGBUR, BILIRUBINUR, KETONESUR, PROTEINUR, UROBILINOGEN, NITRITE, LEUKOCYTESUR  ----------------------------------------------------------------------------------------------------------------   Imaging results:   No results found.  My personal review of EKG: Sinus tach at 103 bpm with bifascicular block  Assessment & Plan  1.  GI bleed, probably lower CT of abdomen without contrast is pending Blood transfusion GI consult Will keep n.p.o. after midnight  2.  Hypertension No medications  3.  Renal failure; unknown chronicity since patient does not usually follow with physicians    Consult nephrology in a.m.  4.  Baseline dementia  Plan: We will consult palliative care in a.m. discussed goals of care at this time and he wants to be full code in the meantime.  Social work will be consulted for placement as well.   DVT Prophylaxis SCDs  AM Labs  Ordered, also please review Full Orders    Code Status full  Disposition Plan: Undetermined  Time spent in minutes : 42 minutes  Condition GUARDED   @SIGNATURE @

## 2018-09-18 NOTE — ED Provider Notes (Signed)
[facility] EMERGENCY DEPARTMENT Provider Note   CSN: [id number] Arrival date & time: 09/18/18  1504     History   Chief Complaint Chief Complaint  Patient presents with  . GI Bleeding  . Loss of Consciousness    HPI Aaron Ward is a 84 y.o. male.  Patient with past history of chronic kidney disease, HTN -- presents the emergency department with acute onset of dark red blood per rectum starting yesterday.  Patient has had multiple episodes of dark red blood with bowel movements.  He began to feel lightheaded this morning.  Patient had a bloody bowel movement this afternoon and felt generally weak and could not stand up from the toilet.  He was very dizzy but did not pass out. EMS was called.  Blood pressure on arrival was in the 70s systolic.  This improved with IV fluids.  No blood thinners.  Patient denies history of colonoscopy or previous GI bleeding.  Reported slurred speech as well without any weakness, numbness, or tingling in extremities.  Patient lives at home by himself.  Daughter was on scene with EMS.  Twelve-lead performed showing right bundle branch block.  Patient denies any chest pain, shortness of breath, abdominal pain.  Reports good urinary output -- states he is urinating every 3 hrs.   Daughter does state that he she received a phone call from the primary care doctor couple of weeks ago stating that the kidneys are very weak.  They decided that they would focus on comfort care and not start hemodialysis.  Daughter reports remote colonoscopy with mention of polyps and diverticulosis.  She confirms no previous GI bleeding.       History reviewed. No pertinent past medical history.  There are no active problems to display for this patient.   History reviewed. No pertinent surgical history.      Home Medications    Prior to Admission medications   Not on File    Family History No family history on file.  Social History Social  History   Tobacco Use  . Smoking status: Never Smoker  . Smokeless tobacco: Never Used  Substance Use Topics  . Alcohol use: Not on file  . Drug use: Not on file     Allergies   Patient has no allergy information on record.   Review of Systems Review of Systems  Constitutional: Negative for fever.  HENT: Negative for rhinorrhea and sore throat.   Eyes: Negative for redness.  Respiratory: Negative for cough and shortness of breath.   Cardiovascular: Negative for chest pain.  Gastrointestinal: Positive for blood in stool. Negative for abdominal pain, diarrhea, nausea and vomiting.  Genitourinary: Negative for decreased urine volume and dysuria.  Musculoskeletal: Negative for myalgias.  Skin: Negative for rash.  Neurological: Positive for speech difficulty and light-headedness. Negative for dizziness, facial asymmetry, numbness and headaches.     Physical Exam Updated Vital Signs BP 135/67   Pulse (!) 103   Temp 97.8 F (36.6 C) (Oral)   Resp (!) 21   SpO2 100%   Physical Exam  Constitutional: He is oriented to person, place, and time. He appears well-developed and well-nourished.  HENT:  Head: Normocephalic and atraumatic.  Right Ear: Tympanic membrane, external ear and ear canal normal.  Left Ear: Tympanic membrane, external ear and ear canal normal.  Nose: Nose normal.  Mouth/Throat: Uvula is midline, oropharynx is clear and moist and mucous membranes are normal.  Eyes: Pupils are equal, round,  and reactive to light. Conjunctivae, EOM and lids are normal. Right eye exhibits no discharge. Left eye exhibits no discharge.  Neck: Normal range of motion. Neck supple.  Cardiovascular: Regular rhythm and normal heart sounds. Tachycardia present.  Pulmonary/Chest: Effort normal and breath sounds normal.  Abdominal: Soft. There is no tenderness.  Genitourinary: Rectal exam shows guaiac positive stool. Rectal exam shows no external hemorrhoid, no internal hemorrhoid and no  fissure.     Musculoskeletal: Normal range of motion.       Cervical back: He exhibits normal range of motion, no tenderness and no bony tenderness.  Neurological: He is alert and oriented to person, place, and time. He has normal strength and normal reflexes. No cranial nerve deficit or sensory deficit. He exhibits normal muscle tone. Coordination normal. GCS eye subscore is 4. GCS verbal subscore is 5. GCS motor subscore is 6.  Skin: Skin is warm and dry.  Psychiatric: He has a normal mood and affect.  Nursing note and vitals reviewed.    ED Treatments / Results  Labs (all labs ordered are listed, but only abnormal results are displayed) Labs Reviewed  COMPREHENSIVE METABOLIC PANEL - Abnormal; Notable for the following components:      Result Value   Chloride 114 (*)    CO2 10 (*)    Glucose, Bld 160 (*)    BUN 136 (*)    Creatinine, Ser 8.36 (*)    Calcium 8.8 (*)    Total Protein 5.8 (*)    Albumin 3.2 (*)    Alkaline Phosphatase 22 (*)    GFR calc non Af Amer 5 (*)    GFR calc Af Amer 6 (*)    Anion gap 17 (*)    All other components within normal limits  CBC WITH DIFFERENTIAL/PLATELET - Abnormal; Notable for the following components:   RBC 2.24 (*)    Hemoglobin 5.9 (*)    HCT 20.2 (*)    MCHC 29.2 (*)    RDW 18.0 (*)    Platelets 143 (*)    nRBC 0.5 (*)    Lymphs Abs 0.6 (*)    All other components within normal limits  PROTIME-INR - Abnormal; Notable for the following components:   Prothrombin Time 17.0 (*)    All other components within normal limits  POC OCCULT BLOOD, ED - Abnormal; Notable for the following components:   Fecal Occult Bld POSITIVE (*)    All other components within normal limits  TYPE AND SCREEN  PREPARE RBC (CROSSMATCH)  ABO/RH    EKG EKG Interpretation  Date/Time:  Friday September 18 2018 16:10:33 EST Ventricular Rate:  103 PR Interval:    QRS Duration: 139 QT Interval:  369 QTC Calculation: 483 R Axis:   -72 Text  Interpretation:  Sinus tachycardia RBBB and LAFB Confirmed by Kristine Royal [phone]) on 09/18/2018 4:19:44 PM   Radiology No results found.  Procedures Procedures (including critical care time)  Medications Ordered in ED Medications  pantoprazole (PROTONIX) injection 40 mg (40 mg Intravenous Given 09/18/18 1530)  sodium chloride 0.9 % bolus 500 mL (0 mLs Intravenous Stopped 09/18/18 1614)  0.9 %  sodium chloride infusion (10 mL/hr Intravenous New Bag/Given 09/18/18 1657)     Initial Impression / Assessment and Plan / ED Course  I have reviewed the triage vital signs and the nursing notes.  Pertinent labs & imaging results that were available during my care of the patient were reviewed by me and considered in my medical  decision making (see chart for details).     Patient seen and examined. Work-up initiated. Medications ordered. Rectal exam performed with RN/NT chaperones. Will need admit. GI bleed order set completed. Tachycardia noted. Additional 500cc fluid bolus.   Vital signs reviewed and are as follows: BP 134/64 (BP Location: Right Arm)   Pulse (!) 125   Temp 97.8 F (36.6 C) (Oral)   Resp 18   SpO2 99%   3:41 PM Patient stable. No focal neuro deficits or signs of CVA. Awaiting family to give better past history. Pt does not know his medications.   4:43 PM additional history received from daughter.  Sound like that the patient has been feeling unwell for approximately 4 to 5 days.  States symptoms started after a flu shot on 11/3.  They are aware of very poor kidney function and patient elects to not have dialysis.  He is willing to receive a blood transfusion.   5:15 PM Spoke with Dr. Marca Ancona of [facility]. Reccs clear liquid diet, non-contrast CT to look for evidence of diverticulitis/diverticulosis. Pt probably not a candidate for colonoscopy but they will consult in AM.   6:31 PM Spoke with Dr. Sharyon Medicus who will see patient.   CRITICAL CARE Performed by: Renne Crigler  PA-C Total critical care time: 40 minutes Critical care time was exclusive of separately billable procedures and treating other patients. Critical care was necessary to treat or prevent imminent or life-threatening deterioration. Critical care was time spent personally by me on the following activities: development of treatment plan with patient and/or surrogate as well as nursing, discussions with consultants, evaluation of patient's response to treatment, examination of patient, obtaining history from patient or surrogate, ordering and performing treatments and interventions, ordering and review of laboratory studies, ordering and review of radiographic studies, pulse oximetry and re-evaluation of patient's condition.    Final Clinical Impressions(s) / ED Diagnoses   Final diagnoses:  Acute GI bleeding  Acute renal failure superimposed on chronic kidney disease, unspecified CKD stage, unspecified acute renal failure type (HCC)  Symptomatic anemia   Admit.   ED Discharge Orders    None       Renne Crigler, Cordelia Poche 09/18/18 1831    Wynetta Fines, MD 09/18/18 [phone]

## 2018-09-18 NOTE — ED Notes (Signed)
ED Provider at bedside. 

## 2018-09-18 NOTE — ED Notes (Signed)
Pt to ER for evaluation of GI bleed and syncopal episode- patient woke up this morning feeling "off" with some dizziness, went to the restroom this evening noting bright red blood in stool, when he went to stand up he remembers "sliding to the floor and that's it." found in the floor by EMS with initial BP in the 70's. Received 400 cc in route.

## 2018-09-18 NOTE — ED Notes (Signed)
This RN transported patient to CT scan

## 2018-09-19 DIAGNOSIS — Z515 Encounter for palliative care: Secondary | ICD-10-CM

## 2018-09-19 DIAGNOSIS — K922 Gastrointestinal hemorrhage, unspecified: Secondary | ICD-10-CM

## 2018-09-19 LAB — HEMOGLOBIN
HEMOGLOBIN: 7 g/dL — AB (ref 13.0–17.0)
Hemoglobin: 6.6 g/dL — CL (ref 13.0–17.0)

## 2018-09-19 LAB — PREPARE RBC (CROSSMATCH)

## 2018-09-19 LAB — HEMOGLOBIN AND HEMATOCRIT, BLOOD
HEMATOCRIT: 23 % — AB (ref 39.0–52.0)
Hemoglobin: 7.2 g/dL — ABNORMAL LOW (ref 13.0–17.0)

## 2018-09-19 MED ORDER — ACETAMINOPHEN 325 MG PO TABS
650.0000 mg | ORAL_TABLET | Freq: Three times a day (TID) | ORAL | Status: DC | PRN
  Filled 2018-09-19: qty 2

## 2018-09-19 NOTE — Consult Note (Signed)
[facility] Consult  Referring Provider: Wynetta Fines, MD/ER Primary Care Physician:  Corine Shelter, MD Primary Gastroenterologist: unassigned  Reason for Consultation:  Rectal bleeding  HPI: Aaron Ward is a 84 y.o. male presents to the ER with severe symptomatic anemia and near syncope after several episodes of rectal bleeding described as maroon stools with large amount of clots.this was not associated with abdominal pain, nausea or vomiting. The patient reports having a colonoscopy over 20 years ago. He is not on any blood thinners or NSAIDs. He is known to have end-stage renal disease and does not want hemodialysis. In the ER he was found to have a hemoglobin of 5.9, has received 2 units of PRBC transfusion and current hemoglobin is 7.2. Last bowel movement was today morning and continue to remain bloody.   Past Medical History:  Diagnosis Date  . Arthritis    "mostly in my knees"  . Chronic kidney disease    "I have weak kidneys because I'm 44"    Past Surgical History:  Procedure Laterality Date  . EYE SURGERY     Cataract removed left eye    Prior to Admission medications   Not on File    Current Facility-Administered Medications  Medication Dose Route Frequency Provider Last Rate Last Dose  . 0.9 %  sodium chloride infusion   Intravenous Continuous Carron Curie, MD 50 mL/hr at 09/19/18 0317    . acetaminophen (TYLENOL) tablet 650 mg  650 mg Oral Q8H PRN Blount, Andi Devon T, NP      . ondansetron (ZOFRAN) tablet 4 mg  4 mg Oral Q6H PRN Carron Curie, MD       Or  . ondansetron (ZOFRAN) injection 4 mg  4 mg Intravenous Q6H PRN Carron Curie, MD      . zolpidem (AMBIEN) tablet 5 mg  5 mg Oral QHS PRN Carron Curie, MD        Allergies as of 09/18/2018  . (No Known Allergies)    History reviewed. No pertinent family history.  Social History   Socioeconomic History  . Marital status: Married    Spouse name: Not on file  . Number of children: Not  on file  . Years of education: Not on file  . Highest education level: Not on file  Occupational History  . Not on file  Social Needs  . Financial resource strain: Not on file  . Food insecurity:    Worry: Not on file    Inability: Not on file  . Transportation needs:    Medical: Not on file    Non-medical: Not on file  Tobacco Use  . Smoking status: Never Smoker  . Smokeless tobacco: Never Used  Substance and Sexual Activity  . Alcohol use: Not on file  . Drug use: Not on file  . Sexual activity: Not on file  Lifestyle  . Physical activity:    Days per week: Not on file    Minutes per session: Not on file  . Stress: Not on file  Relationships  . Social connections:    Talks on phone: Not on file    Gets together: Not on file    Attends religious service: Not on file    Active member of club or organization: Not on file    Attends meetings of clubs or organizations: Not on file    Relationship status: Not on file  . Intimate partner violence:    Fear of current or ex partner: Not on  file    Emotionally abused: Not on file    Physically abused: Not on file    Forced sexual activity: Not on file  Other Topics Concern  . Not on file  Social History Narrative  . Not on file    Review of Systems: Positive for: GI: Described in detail in HPI.    Gen:  fatigue, weakness, malaise,Denies any fever, chills, rigors, night sweats, anorexia, involuntary weight loss and sleep disorder CV: Denies chest pain, angina, palpitations, syncope, orthopnea, PND, peripheral edema, and claudication. Resp: Denies dyspnea, cough, sputum, wheezing, coughing up blood. GU : Denies urinary burning, blood in urine, urinary frequency, urinary hesitancy, nocturnal urination, and urinary incontinence. MS: Denies joint pain or swelling.  Denies muscle weakness, cramps, atrophy.  Derm: Denies rash, itching, oral ulcerations, hives, unhealing ulcers.  Psych: Denies depression, anxiety, memory loss,  suicidal ideation, hallucinations,  and confusion. Heme: Denies bruising, bleeding, and enlarged lymph nodes. Neuro:  Dizziness, Denies any headaches,  paresthesias. Endo:  Denies any problems with DM, thyroid, adrenal function.  Physical Exam: Vital signs in last 24 hours: Temp:  [97.5 F (36.4 C)-98.4 F (36.9 C)] 97.5 F (36.4 C) (11/09 0733) Pulse Rate:  [89-125] 89 (11/09 0733) Resp:  [14-24] 18 (11/09 0733) BP: (125-167)/(57-110) 142/86 (11/09 0733) SpO2:  [98 %-100 %] 99 % (11/09 0733) Weight:  [63 kg] 63 kg (11/08 1959) Last BM Date: 09/19/18  General:   Alert,  Well-developed, well-nourished, pleasant and cooperative in NAD Head:  Normocephalic and atraumatic. Eyes:  Sclera clear, no icterus.   Obvious pallor. Ears:  Hard of HEARING. Nose:  No deformity, discharge,  or lesions. Mouth:  No deformity or lesions.  Oropharynx pink & moist. Neck:  Supple; no masses or thyromegaly. Lungs:  Clear throughout to auscultation.   No wheezes, crackles, or rhonchi. No acute distress. Heart:  Regular rate and rhythm; no murmurs, clicks, rubs,  or gallops. Extremities:  Without clubbing or edema. Neurologic:  Alert and  oriented x4;  grossly normal neurologically. Skin:  Intact without significant lesions or rashes. Psych:  Alert and cooperative. Normal mood and affect. Abdomen:  Soft, nontender and nondistended. No masses, hepatosplenomegaly or hernias noted. Normal bowel sounds, without guarding, and without rebound.         Lab Results: Recent Labs    09/18/18 1531 09/18/18 2109 09/19/18 0631  WBC 8.5  --   --   HGB 5.9* 6.8* 7.2*  HCT 20.2*  --  23.0*  PLT 143*  --   --    BMET Recent Labs    09/18/18 1531  NA 141  K 4.9  CL 114*  CO2 10*  GLUCOSE 160*  BUN 136*  CREATININE 8.36*  CALCIUM 8.8*   LFT Recent Labs    09/18/18 1531  PROT 5.8*  ALBUMIN 3.2*  AST 29  ALT 15  ALKPHOS 22*  BILITOT 0.5   PT/INR Recent Labs    09/18/18 1531  LABPROT  17.0*  INR 1.40    Studies/Results: Ct Abdomen Pelvis Wo Contrast  Result Date: 09/18/2018 CLINICAL DATA:  Rectal bleeding. EXAM: CT ABDOMEN AND PELVIS WITHOUT CONTRAST TECHNIQUE: Multidetector CT imaging of the abdomen and pelvis was performed following the standard protocol without IV contrast. COMPARISON:  None. FINDINGS: Lower chest: No acute abnormality. Hepatobiliary: No focal liver abnormality is seen. No gallstones, gallbladder wall thickening, or biliary dilatation. Pancreas: Unremarkable. No pancreatic ductal dilatation or surrounding inflammatory changes. Spleen: Normal in size without focal abnormality. Adrenals/Urinary  Tract: Adrenal glands appear normal. Bilateral renal cysts are noted, including probable hyperdense cyst involving lower pole of left kidney. No hydronephrosis or renal obstruction is noted. No renal or ureteral calculi are noted. Urinary bladder is unremarkable. Stomach/Bowel: Stomach is within normal limits. Appendix appears normal. No evidence of bowel wall thickening, distention, or inflammatory changes. Sigmoid diverticulosis is noted without inflammation. Vascular/Lymphatic: No significant vascular findings are present. No enlarged abdominal or pelvic lymph nodes. Reproductive: Moderate prostatic enlargement is noted. Other: No abdominal wall hernia or abnormality. No abdominopelvic ascites. Musculoskeletal: Severe multilevel degenerative disc disease is noted in the lumbar spine. No acute osseous abnormality is noted. IMPRESSION: Sigmoid diverticulosis without inflammation. Moderate prostatic enlargement. Bilateral renal cysts. No acute abnormality seen in the abdomen or pelvis. Electronically Signed   By: Lupita Raider, M.D.   On: 09/18/2018 19:09    Impression: Painless hematochezia with sigmoid diverticulosis-likely diverticular bleeding End-stage renal disease, BUN136/creatinine 8.36/GFR 6-does not want hemodialysis, is DO NOT RESUSCITATE  Plan: Discussed with  patient regarding bleeding scan and embolization if needed, however this would worsen renal function and he may not be a good candidate with end-stage renal disease. He does not want a diagnostic colonoscopy. I have discussed about his current situation with the patient and his daughter at bedside. As patient does not want to proceed with a diagnostic colonoscopy or a bleeding scan/embolization, the only option at this point would be to monitor and transfuse him as needed.  If bleeding is ongoing, the patient wants to be comfort care and does not want any invasive procedures. Will start patient on clear liquid diet.    LOS: 1 day   Kerin Salen, M.D.  09/19/2018, 11:07 AM  Pager [phone] If no answer or after 5 PM call [phone]

## 2018-09-19 NOTE — Progress Notes (Signed)
Pt. Had another occurrence of bright red blood with clots and stool, this time medium amount about 100cc. Vital signs stable: BP 164/77, HR 96, temp 97.6, Resp. 18, O2 100.  Albesa Seen, NP notified. Lab is here to draw CBC.

## 2018-09-19 NOTE — Progress Notes (Signed)
Asked to see pt here with GI bleed.  Per RN, pt had large bloody bowel movement with clots.  BP-159/80, HR-95, RR-18, SpO2-100% on RA, pt alert and oriented.  Hgb-6.8 @ 2109.  Pt currently receiving 2nd unit PRBCs as ordered. Blount, NP to bedside to assess patient.  No new orders received.  Bedside RN to continue to monitor pt closely and recheck hgb after blood completed.

## 2018-09-19 NOTE — Progress Notes (Signed)
Pt. had large bright red blood from rectum proximately 500 cc with multiple large clots. Rapid Response Mindy, RN and Linton Flemings, NP notified. VS were stable 138/61, Temp 97.5, HR 108, O2 100.

## 2018-09-19 NOTE — Consult Note (Addendum)
Consultation Note Date: 09/19/2018   Patient Name: Aaron Ward  DOB: 1929/08/15  MRN: [id number]  Age / Sex: 84 y.o., male  PCP: Corine Shelter, MD Referring Physician: Barnetta Chapel, MD  Reason for Consultation: Establishing goals of care  HPI/Patient Profile: 84 y.o. male  with past medical history of ESRD not yet on dialysis, hypertension, admitted on 09/18/2018 with 2-day history of rectal bleeding.  Apparently patient has been followed for worsening renal failure by his PCP.  There is been discussion about initiation of hemodialysis patient has refused.  Baseline creatinine unknown but was 8.36 upon admission. Palliative care was consulted to help address goals.  Clinical Assessment and Goals of Care: With patient his daughter.  At baseline, patient lives at home alone.  He is functionally independent.  Still drives.  Daughter confirms the patient has had worsening renal failure with previous discussion for need to initiate hemodialysis.  Both daughter and patient confirm that he does not want to start hemodialysis.  Instead, patient wants to focus on quality of life and comfort even if it results in a shortened life expectancy.  Daughter says she is very familiar with the process of end-stage renal disease given the recent passing of her ex-husband with similar problems.  Ideally, daughter would like for patient to be able to return home at time of discharge if he is functionally able.  However, daughter lives in [place] and is not immediately available to assist him with his care.  There is a granddaughter that lives in town but patient would have to be relatively independent in order to discharge home.  We discussed option of short-term rehab.  We also discussed the option of hospice in detail either at home or in a residential hospice facility.  Daughter says she is very familiar with hospice  care following the loss of several loved ones. Daughter says that in the event of acute decline that she would want patient transferred to [facility].  In the event that patient is able to return home, patient and daughter would be interested in hospice involvement.  They would like to speak with gastroenterology.  However, daughter is unsure the patient would consent to invasive work-up.  Patient has advance directives.  His daughter is his healthcare power of attorney.  Patient has a living well.  We discussed CODE STATUS.  Both patient and daughter verbalized clearly an intent to be "DO NOT RESUSCITATE."  I clarified this to me and the patient does not want to be resuscitated nor have his life prolonged artificially on machines.  Will change CODE STATUS to reflect patient decision for DNR.  Patient will need a DNR form signed to take home at time of discharge.   SUMMARY OF RECOMMENDATIONS   1.  Continue supportive care 2.  Follow CBC and BMET 3.  Await recommendations from GI 4.  Social work/care management consult 5.  PT eval when medically appropriate 6.  Recommend hospice involvement 7.  Disposition unclear: In the event the patient discharges  home he should be followed by hospice.  If patient declines daughter would be interested in transfer to [facility].  Daughter would be interested in short-term rehab if clinically appropriate, at which point he should be followed by palliative care at the facility. 8. DNR    Primary Diagnoses: Present on Admission: . GI bleed   I have reviewed the medical record, interviewed the patient and family, and examined the patient. The following aspects are pertinent.  Past Medical History:  Diagnosis Date  . Arthritis    "mostly in my knees"  . Chronic kidney disease    "I have weak kidneys because I'm 53"   Social History   Socioeconomic History  . Marital status: Married    Spouse name: Not on file  . Number of children: Not on file    . Years of education: Not on file  . Highest education level: Not on file  Occupational History  . Not on file  Social Needs  . Financial resource strain: Not on file  . Food insecurity:    Worry: Not on file    Inability: Not on file  . Transportation needs:    Medical: Not on file    Non-medical: Not on file  Tobacco Use  . Smoking status: Never Smoker  . Smokeless tobacco: Never Used  Substance and Sexual Activity  . Alcohol use: Not on file  . Drug use: Not on file  . Sexual activity: Not on file  Lifestyle  . Physical activity:    Days per week: Not on file    Minutes per session: Not on file  . Stress: Not on file  Relationships  . Social connections:    Talks on phone: Not on file    Gets together: Not on file    Attends religious service: Not on file    Active member of club or organization: Not on file    Attends meetings of clubs or organizations: Not on file    Relationship status: Not on file  Other Topics Concern  . Not on file  Social History Narrative  . Not on file   History reviewed. No pertinent family history. Scheduled Meds: Continuous Infusions: . sodium chloride 50 mL/hr at 09/19/18 0317   PRN Meds:.acetaminophen, ondansetron **OR** ondansetron (ZOFRAN) IV, zolpidem Medications Prior to Admission:  Prior to Admission medications   Not on File   No Known Allergies Review of Systems  Constitutional: Positive for activity change and fatigue.    Physical Exam  Vital Signs: BP (!) 142/86 (BP Location: Right Arm)   Pulse 89   Temp (!) 97.5 F (36.4 C) (Oral)   Resp 18   Ht 5\' 6"  (1.676 m)   Wt 63 kg   SpO2 99%   BMI 22.42 kg/m  Pain Scale: 0-10   Pain Score: 0-No pain   SpO2: SpO2: 99 % O2 Device:SpO2: 99 % O2 Flow Rate: .   IO: Intake/output summary:   Intake/Output Summary (Last 24 hours) at 09/19/2018 0937 Last data filed at 09/19/2018 0837 Gross per 24 hour  Intake 917.97 ml  Output 570 ml  Net 347.97 ml    LBM:  Last BM Date: 09/19/18 Baseline Weight: Weight: 63 kg Most recent weight: Weight: 63 kg     Palliative Assessment/Data:     Time In: 0900 Time Out: 0950 Time Total: 50 minutes Greater than 50%  of this time was spent counseling and coordinating care related to the above assessment and plan.  Signed by: Irean Hong, NP   Please contact Palliative Medicine Team phone at [phone] for questions and concerns.  For individual provider: See Shea Evans

## 2018-09-19 NOTE — Progress Notes (Signed)
CRITICAL VALUE ALERT  Critical Value:  HGB 6.6  Date & Time Notied:  09/19/2018 2137  Provider Notified: Opyd  Orders Received/Actions taken: Orders to transfuse 1 unit PRBC

## 2018-09-19 NOTE — Progress Notes (Signed)
PROGRESS NOTE    Aaron Ward  UJW:[id number] DOB: [birth year] DOA: 09/18/2018 PCP: Corine Shelter, MD  Outpatient Specialists:   Brief Narrative:  Patient is an 84 year old African-American male, past medical history significant for hypertension, likely end-stage renal disease, but patient has refused hemodialysis.  Patient is admitted with rectal bleed, reported as bright red blood, with episodes of dark-colored blood as well.  The patient has been transfused with packed red blood cells.  GI input is appreciated.  Discussed with the patient's daughter, Aaron Ward, and the  daughter has asked for hospice team to be consulted and patient transferred to [facility] if possible.  Patient continues to pass dark-colored blood, likely old blood.  Hemoglobin is down to around 7 g/dL.   Assessment & Plan:   Active Problems:   GI bleed   Acute GI bleeding   Palliative care encounter   Rectal bleed: Suspect diverticular bleed. Patient's daughter has asked for hospice input. Further care will depend on the goal of care. Continue to monitor H/H onto the hospice team/palliative care team sees the patient. Guarded prognosis.  Acute kidney injury on chronic kidney disease versus end-stage renal disease: BUN is currently 136 Serum creatinine is 8.36 Patient's baseline is not known -According to the patient and the patient's daughter, they have discussed extensively with the patient's primary care provider and they do not want dialysis.  Anemia, likely multifactorial, including acute blood loss anemia: Continue to monitor for now. H/H is currently being checked every 8 hours. Depending on the goal of care, will transfuse as deemed necessary  DVT prophylaxis: SCD Code Status: DO NOT RESUSCITATE Family Communication: Patient's daughter, Aaron Ward Disposition Plan: Possible hospice house   Consultants:   GI  Procedures:   None  Antimicrobials:   None   Subjective: No  new complaints Continues to have rectal bleed No chest pain  Objective: Vitals:   09/19/18 0540 09/19/18 0733 09/19/18 1321 09/19/18 1616  BP: (!) 164/77 (!) 142/86 (!) 122/50 131/69  Pulse: 96 89 (!) 110 (!) 106  Resp: 18 18 20 18   Temp: 97.6 F (36.4 C) (!) 97.5 F (36.4 C)  97.7 F (36.5 C)  TempSrc: Oral Oral  Oral  SpO2: 100% 99%  100%  Weight:      Height:        Intake/Output Summary (Last 24 hours) at 09/19/2018 1733 Last data filed at 09/19/2018 1404 Gross per 24 hour  Intake 1217.97 ml  Output 570 ml  Net 647.97 ml   Filed Weights   09/18/18 1959  Weight: 63 kg    Examination:  General exam: Appears calm and comfortable.  Pale.  Dry buccal mucosa Respiratory system: Decreased air entry globally.   Cardiovascular system: S1 & S2 heard. No pedal edema. Gastrointestinal system: Abdomen is nondistended, soft and nontender. No organomegaly or masses felt. Normal bowel sounds heard. Central nervous system: Alert and oriented. No focal neurological deficits. Extremities: No edema  Data Reviewed: I have personally reviewed following labs and imaging studies  CBC: Recent Labs  Lab 09/18/18 1531 09/18/18 2109 09/19/18 0631 09/19/18 1321  WBC 8.5  --   --   --   NEUTROABS 7.2  --   --   --   HGB 5.9* 6.8* 7.2* 7.0*  HCT 20.2*  --  23.0*  --   MCV 90.2  --   --   --   PLT 143*  --   --   --    Basic  Metabolic Panel: Recent Labs  Lab 09/18/18 1531  NA 141  K 4.9  CL 114*  CO2 10*  GLUCOSE 160*  BUN 136*  CREATININE 8.36*  CALCIUM 8.8*   GFR: Estimated Creatinine Clearance: 5.5 mL/min (A) (by C-G formula based on SCr of 8.36 mg/dL (H)). Liver Function Tests: Recent Labs  Lab 09/18/18 1531  AST 29  ALT 15  ALKPHOS 22*  BILITOT 0.5  PROT 5.8*  ALBUMIN 3.2*   No results for input(s): LIPASE, AMYLASE in the last 168 hours. No results for input(s): AMMONIA in the last 168 hours. Coagulation Profile: Recent Labs  Lab 09/18/18 1531  INR 1.40    Cardiac Enzymes: No results for input(s): CKTOTAL, CKMB, CKMBINDEX, TROPONINI in the last 168 hours. BNP (last 3 results) No results for input(s): PROBNP in the last 8760 hours. HbA1C: No results for input(s): HGBA1C in the last 72 hours. CBG: No results for input(s): GLUCAP in the last 168 hours. Lipid Profile: No results for input(s): CHOL, HDL, LDLCALC, TRIG, CHOLHDL, LDLDIRECT in the last 72 hours. Thyroid Function Tests: No results for input(s): TSH, T4TOTAL, FREET4, T3FREE, THYROIDAB in the last 72 hours. Anemia Panel: No results for input(s): VITAMINB12, FOLATE, FERRITIN, TIBC, IRON, RETICCTPCT in the last 72 hours. Urine analysis: No results found for: COLORURINE, APPEARANCEUR, LABSPEC, PHURINE, GLUCOSEU, HGBUR, BILIRUBINUR, KETONESUR, PROTEINUR, UROBILINOGEN, NITRITE, LEUKOCYTESUR Sepsis Labs: @LABRCNTIP (procalcitonin:4,lacticidven:4)  )No results found for this or any previous visit (from the past 240 hour(s)).       Radiology Studies: Ct Abdomen Pelvis Wo Contrast  Result Date: 09/18/2018 CLINICAL DATA:  Rectal bleeding. EXAM: CT ABDOMEN AND PELVIS WITHOUT CONTRAST TECHNIQUE: Multidetector CT imaging of the abdomen and pelvis was performed following the standard protocol without IV contrast. COMPARISON:  None. FINDINGS: Lower chest: No acute abnormality. Hepatobiliary: No focal liver abnormality is seen. No gallstones, gallbladder wall thickening, or biliary dilatation. Pancreas: Unremarkable. No pancreatic ductal dilatation or surrounding inflammatory changes. Spleen: Normal in size without focal abnormality. Adrenals/Urinary Tract: Adrenal glands appear normal. Bilateral renal cysts are noted, including probable hyperdense cyst involving lower pole of left kidney. No hydronephrosis or renal obstruction is noted. No renal or ureteral calculi are noted. Urinary bladder is unremarkable. Stomach/Bowel: Stomach is within normal limits. Appendix appears normal. No evidence of  bowel wall thickening, distention, or inflammatory changes. Sigmoid diverticulosis is noted without inflammation. Vascular/Lymphatic: No significant vascular findings are present. No enlarged abdominal or pelvic lymph nodes. Reproductive: Moderate prostatic enlargement is noted. Other: No abdominal wall hernia or abnormality. No abdominopelvic ascites. Musculoskeletal: Severe multilevel degenerative disc disease is noted in the lumbar spine. No acute osseous abnormality is noted. IMPRESSION: Sigmoid diverticulosis without inflammation. Moderate prostatic enlargement. Bilateral renal cysts. No acute abnormality seen in the abdomen or pelvis. Electronically Signed   By: Lupita Raider, M.D.   On: 09/18/2018 19:09        Scheduled Meds: Continuous Infusions: . sodium chloride 50 mL/hr at 09/19/18 0317     LOS: 1 day    Time spent: 35 minutes.    Berton Mount, MD  [facility] Pager #: [phone] 7PM-7AM contact night coverage as above

## 2018-09-19 NOTE — Progress Notes (Signed)
Patient had medium BM dark red blood with clots, notified Dr. Dartha Lodge,

## 2018-09-19 NOTE — Progress Notes (Signed)
Patient's Hgb at 7, notified Dr. Dartha Lodge.

## 2018-09-20 ENCOUNTER — Other Ambulatory Visit: Payer: Self-pay

## 2018-09-20 LAB — PREPARE RBC (CROSSMATCH)

## 2018-09-20 LAB — HEMOGLOBIN AND HEMATOCRIT, BLOOD
HCT: 35.5 % — ABNORMAL LOW (ref 39.0–52.0)
Hemoglobin: 11.1 g/dL — ABNORMAL LOW (ref 13.0–17.0)

## 2018-09-20 MED ORDER — SODIUM CHLORIDE 0.9% IV SOLUTION
Freq: Once | INTRAVENOUS | Status: AC
  Administered 2018-09-20: via INTRAVENOUS

## 2018-09-20 MED ORDER — PSYLLIUM 95 % PO PACK
1.0000 | PACK | Freq: Every day | ORAL | Status: DC
  Administered 2018-09-20 – 2018-09-21 (×2): 1 via ORAL
  Filled 2018-09-20 (×2): qty 1

## 2018-09-20 NOTE — Progress Notes (Signed)
Spoke with the daughter, reports no bloody BM since yesterday. Has received 3 units PRBC transfusions, Hb increased to 11.1 from 6.6.  From GI standpoint, as family member/daughter and patient do not want invasive procedures, there is not much to add. Recommend resuming regular diet , take bulk forming laxatives such as metamucil/benefiber on a regular basis. Will sign off, please recall if needed.  Kerin Salen, MD

## 2018-09-20 NOTE — Progress Notes (Signed)
HGB is 6.7, notified MD at this time.  Larey Days, RN

## 2018-09-20 NOTE — Progress Notes (Signed)
PROGRESS NOTE    Aaron Ward  ZOX:[id number] DOB: [birth year] DOA: 09/18/2018 PCP: Corine Shelter, MD  Outpatient Specialists:   Brief Narrative:  Patient is an 84 year old African-American male, past medical history significant for hypertension, likely end-stage renal disease, but patient has refused hemodialysis.  Patient is admitted with rectal bleed, reported as bright red blood, with episodes of dark-colored blood as well.  The patient has been transfused with packed red blood cells.  GI input is appreciated.  Discussed with the patient's daughter, Theodore Demark, and the  daughter has asked for hospice team to be consulted and patient transferred to [facility] if possible.  Patient continues to pass dark-colored blood, likely old blood.  Hemoglobin is down to around 7 g/dL.  09/20/2018: Patient seen.  No new changes.  Hemoglobin this morning was documented as 11.1 g/dL (doubt this is correct).  We will continue to monitor.  Last noted bleeding was last night, not clear if bright red blood or dark blood.  Assessment & Plan:   Active Problems:   GI bleed   Acute GI bleeding   Palliative care encounter   Rectal bleed: Suspect diverticular bleed. Patient's daughter has asked for hospice input. Further care will depend on the goal of care. Continue to monitor H/H onto the hospice team/palliative care team sees the patient. Transfuse packed red blood cells as needed. Guarded prognosis.  Acute kidney injury on chronic kidney disease versus end-stage renal disease: BUN is currently 136 Serum creatinine is 8.36 Patient's baseline is not known -According to the patient and the patient's daughter, they have discussed extensively with the patient's primary care provider and they do not want dialysis.  Anemia, likely multifactorial, including acute blood loss anemia: Continue to monitor for now. H/H is currently being checked every 8 hours. Depending on the goal of care, will  transfuse as deemed necessary  DVT prophylaxis: SCD Code Status: DO NOT RESUSCITATE Family Communication: Patient's daughter, Theodore Demark Disposition Plan: Possible hospice house   Consultants:   GI  Procedures:   None  Antimicrobials:   None   Subjective: No new complaints Continues to have rectal bleed No chest pain  Objective: Vitals:   09/19/18 2237 09/20/18 0201 09/20/18 0551 09/20/18 0751  BP: (!) 145/77 (!) 156/77 (!) 185/85 (!) 165/73  Pulse: (!) 102 93 93 94  Resp:   16 18  Temp: 97.9 F (36.6 C) 97.8 F (36.6 C) 98.4 F (36.9 C) 98.5 F (36.9 C)  TempSrc: Oral Oral  Oral  SpO2: 100%  100% 98%  Weight:      Height:        Intake/Output Summary (Last 24 hours) at 09/20/2018 0943 Last data filed at 09/20/2018 0800 Gross per 24 hour  Intake 2249.87 ml  Output 450 ml  Net 1799.87 ml   Filed Weights   09/18/18 1959 09/19/18 2038  Weight: 63 kg 63.1 kg    Examination:  General exam: Appears calm and comfortable.  Pale.  Dry buccal mucosa Respiratory system: Decreased air entry globally.   Cardiovascular system: S1 & S2 heard. No pedal edema. Gastrointestinal system: Abdomen is nondistended, soft and nontender. No organomegaly or masses felt. Normal bowel sounds heard. Central nervous system: Alert and oriented. No focal neurological deficits. Extremities: No edema  Data Reviewed: I have personally reviewed following labs and imaging studies  CBC: Recent Labs  Lab 09/18/18 1531 09/18/18 2109 09/19/18 0631 09/19/18 1321 09/19/18 2112 09/20/18 0504  WBC 8.5  --   --   --   --   --  NEUTROABS 7.2  --   --   --   --   --   HGB 5.9* 6.8* 7.2* 7.0* 6.6* 11.1*  HCT 20.2*  --  23.0*  --   --  35.5*  MCV 90.2  --   --   --   --   --   PLT 143*  --   --   --   --   --    Basic Metabolic Panel: Recent Labs  Lab 09/18/18 1531  NA 141  K 4.9  CL 114*  CO2 10*  GLUCOSE 160*  BUN 136*  CREATININE 8.36*  CALCIUM 8.8*   GFR: Estimated  Creatinine Clearance: 5.6 mL/min (A) (by C-G formula based on SCr of 8.36 mg/dL (H)). Liver Function Tests: Recent Labs  Lab 09/18/18 1531  AST 29  ALT 15  ALKPHOS 22*  BILITOT 0.5  PROT 5.8*  ALBUMIN 3.2*   No results for input(s): LIPASE, AMYLASE in the last 168 hours. No results for input(s): AMMONIA in the last 168 hours. Coagulation Profile: Recent Labs  Lab 09/18/18 1531  INR 1.40   Cardiac Enzymes: No results for input(s): CKTOTAL, CKMB, CKMBINDEX, TROPONINI in the last 168 hours. BNP (last 3 results) No results for input(s): PROBNP in the last 8760 hours. HbA1C: No results for input(s): HGBA1C in the last 72 hours. CBG: No results for input(s): GLUCAP in the last 168 hours. Lipid Profile: No results for input(s): CHOL, HDL, LDLCALC, TRIG, CHOLHDL, LDLDIRECT in the last 72 hours. Thyroid Function Tests: No results for input(s): TSH, T4TOTAL, FREET4, T3FREE, THYROIDAB in the last 72 hours. Anemia Panel: No results for input(s): VITAMINB12, FOLATE, FERRITIN, TIBC, IRON, RETICCTPCT in the last 72 hours. Urine analysis: No results found for: COLORURINE, APPEARANCEUR, LABSPEC, PHURINE, GLUCOSEU, HGBUR, BILIRUBINUR, KETONESUR, PROTEINUR, UROBILINOGEN, NITRITE, LEUKOCYTESUR Sepsis Labs: @LABRCNTIP (procalcitonin:4,lacticidven:4)  )No results found for this or any previous visit (from the past 240 hour(s)).       Radiology Studies: Ct Abdomen Pelvis Wo Contrast  Result Date: 09/18/2018 CLINICAL DATA:  Rectal bleeding. EXAM: CT ABDOMEN AND PELVIS WITHOUT CONTRAST TECHNIQUE: Multidetector CT imaging of the abdomen and pelvis was performed following the standard protocol without IV contrast. COMPARISON:  None. FINDINGS: Lower chest: No acute abnormality. Hepatobiliary: No focal liver abnormality is seen. No gallstones, gallbladder wall thickening, or biliary dilatation. Pancreas: Unremarkable. No pancreatic ductal dilatation or surrounding inflammatory changes. Spleen:  Normal in size without focal abnormality. Adrenals/Urinary Tract: Adrenal glands appear normal. Bilateral renal cysts are noted, including probable hyperdense cyst involving lower pole of left kidney. No hydronephrosis or renal obstruction is noted. No renal or ureteral calculi are noted. Urinary bladder is unremarkable. Stomach/Bowel: Stomach is within normal limits. Appendix appears normal. No evidence of bowel wall thickening, distention, or inflammatory changes. Sigmoid diverticulosis is noted without inflammation. Vascular/Lymphatic: No significant vascular findings are present. No enlarged abdominal or pelvic lymph nodes. Reproductive: Moderate prostatic enlargement is noted. Other: No abdominal wall hernia or abnormality. No abdominopelvic ascites. Musculoskeletal: Severe multilevel degenerative disc disease is noted in the lumbar spine. No acute osseous abnormality is noted. IMPRESSION: Sigmoid diverticulosis without inflammation. Moderate prostatic enlargement. Bilateral renal cysts. No acute abnormality seen in the abdomen or pelvis. Electronically Signed   By: Lupita Raider, M.D.   On: 09/18/2018 19:09        Scheduled Meds: Continuous Infusions: . sodium chloride 50 mL/hr at 09/19/18 0317     LOS: 2 days    Time spent: 35 minutes.  Dana Allan, MD  [facility] Pager #: [phone] 7PM-7AM contact night coverage as above

## 2018-09-20 NOTE — Progress Notes (Signed)
Patients Hgb is 7.2. No bloody bowel movements this shift. Dartha Lodge, MD notified. Will continue to monitor.

## 2018-09-21 ENCOUNTER — Encounter (HOSPITAL_COMMUNITY): Payer: Self-pay

## 2018-09-21 DIAGNOSIS — N186 End stage renal disease: Secondary | ICD-10-CM

## 2018-09-21 LAB — HEMOGLOBIN
HEMOGLOBIN: 7.2 g/dL — AB (ref 13.0–17.0)
Hemoglobin: 6.7 g/dL — CL (ref 13.0–17.0)
Hemoglobin: 9.6 g/dL — ABNORMAL LOW (ref 13.0–17.0)

## 2018-09-21 LAB — TYPE AND SCREEN
ABO/RH(D): O NEG
ANTIBODY SCREEN: NEGATIVE

## 2018-09-21 LAB — HEMOGLOBIN AND HEMATOCRIT, BLOOD
HCT: 26.4 % — ABNORMAL LOW (ref 39.0–52.0)
Hemoglobin: 8.1 g/dL — ABNORMAL LOW (ref 13.0–17.0)

## 2018-09-21 MED ORDER — GLYCOPYRROLATE 0.2 MG/ML IJ SOLN: 0.2000 mg | Freq: Four times a day (QID) | INTRAMUSCULAR | Status: DC | PRN

## 2018-09-21 MED ORDER — LORAZEPAM 2 MG/ML IJ SOLN: 0.5000 mg | INTRAMUSCULAR | Status: DC | PRN

## 2018-09-21 MED ORDER — SODIUM CHLORIDE 0.9 % IV SOLN
INTRAVENOUS | Status: DC
  Administered 2018-09-21: 21:00:00 via INTRAVENOUS

## 2018-09-21 MED ORDER — HYDROMORPHONE HCL 1 MG/ML IJ SOLN
0.5000 mg | INTRAMUSCULAR | Status: DC | PRN
  Administered 2018-09-22: 0.5 mg via INTRAVENOUS
  Filled 2018-09-21: qty 1

## 2018-09-21 MED ORDER — ACETAMINOPHEN 325 MG PO TABS: 650.0000 mg | ORAL_TABLET | Freq: Three times a day (TID) | ORAL | Status: DC | PRN

## 2018-09-21 NOTE — Progress Notes (Signed)
Palliative Medicine RN Note: Followed up with patient and his daughter Aaron Ward. Discussed patient with Dr Dartha Lodge and Dr Phillips Odor.   Patient and daughter explained that they have been told that his kidneys are not doing well, and he absolutely, under no circumstances, wants HD. They also report that the physicians have told them that he is bleeding and that he has needed repeated transfusions. Aaron Ward reports that she and [name] have discussed his wishes for EOL for years.  We discussed [name]'s goals and desires for the end of his life. His priest has visited and given him sacrament of the sick. He is not tolerating food well, but enjoyed his jello. He understands that his time is very short. He wishes to have a pain-free, dignified death. He does not want to prolong his death with further blood transfusions. He and his family understand that further blood draws would not be in line with his goal of comfort.   I discussed with them Dr Lamar Blinks orders for hydromorphone (no morphine due to renal impairment) and lorazepam. He is naive to both, so she started low doses. We also changed his VS to once daily and liberalized diet.   Mr Laws has an order for [facility]. The family requests them specifically because he has seen it and only wants to go there. We discussed the plan for me to follow up tomorrow morning. I am concerned that, if he continues to have multiple bloody BMs, he may be too weak to move to BP when a bed is available. Both [name] and Aaron Ward verbalized understanding. We have requested a bed on [address] [place] for family comfort (his daughter is planning to stay with him overnight).  PMT is available for questions from 0700-1900. Outside of these hours, please call the attending service.  Margret Chance Leandre Wien, RN, BSN, [facility] Palliative Medicine Team 09/21/2018 1:42 PM Office [phone]

## 2018-09-21 NOTE — Progress Notes (Signed)
Palliative Medicine RN Note: Chart check. Patient is pending [facility] placement; I spoke to [place]; no bed today.  I also left a message for Mr Cammarata's daughter Rene Kocher that I was contacting [facility].  Margret Chance Gabriana Wilmott, RN, BSN, [facility] Palliative Medicine Team 09/21/2018 11:25 AM Office [phone]

## 2018-09-21 NOTE — Clinical Social Work Note (Signed)
CSW received consult for residential hospice placement. Call received from Palliative nurse regarding patient and she initiated contact with family and [facility] chosen and contacted by [employer]. CSW received call later from Forrestine Him with [facility] and informed that no beds available today. Contact made with daughter later today (4:20 pm) regarding hospice placement and daughter advised that another facility may have to be chosen if [facility] has no bed tomorrow. Daughter indicated that going outside of [facility] is not feasible for her family, and that she has been advised that patient will be moved to [facility]. Receiving CSW will be updated regarding patient.  Genelle Bal, MSW, LCSW Licensed Clinical Social Worker Clinical Social Work Department [employer] [phone]

## 2018-09-21 NOTE — Progress Notes (Addendum)
[facility] - [facility] [place] ([facility]) - RN Note   Received request from [facility] for [facility] bed today. Unfortunately, [facility] is not able to offer a bed at this time. Visited Patient in room who was sleeping quietly so reached out to Daughter Rene Kocher via telephone to acknowledge referral and discuss Hospice services. Answered questions and supported Rene Kocher then notified her that a [facility] Liaison will update LCSW in the morning or sooner on [facility] bed becomes available.   LCSW made aware of above.   Thanks for the referral. And please call with any Hospice related questions,  Roda Shutters, RN [facility] Liaison [phone]  [facility] Liaisons found on AMION.

## 2018-09-21 NOTE — Progress Notes (Signed)
PROGRESS NOTE    Aaron Ward  WJX:[id number] DOB: 1929-02-01 DOA: 09/18/2018 PCP: Corine Shelter, MD  Outpatient Specialists:   Brief Narrative:  Patient is an 84 year old African-American male, past medical history significant for hypertension, likely end-stage renal disease, but patient has refused hemodialysis.  Patient was admitted with rectal bleed, reported as bright red blood, with intermittent of dark-colored blood as well, likely old blood.  The patient has been transfused with packed red blood cells.  GI input is appreciated.  Discussed with the patient's daughter, Theodore Demark, and the  daughter has asked for hospice team to be consulted and patient transferred to [facility] if possible.  Patient continues to rectal bleed.    09/21/2018: Patient seen.  Patient was seen alongside patient's daughter and granddaughter.  Also discussed with the hospice team.  The patient and the patient's family do not want any further blood transfusion of blood checks.  Patient will be transitioned to comfort directed care.  Hospice team input is highly appreciated.    Assessment & Plan:   Active Problems:   GI bleed   Acute GI bleeding   Palliative care encounter   Rectal bleed: Suspect diverticular bleed. Patient's daughter has asked for hospice input. The plan is for comfort directed care.  No further blood transfusion and no further H/H checks.  Acute kidney injury on chronic kidney disease versus end-stage renal disease: Last BUN checked was 136. Serum creatinine was 8.36 Patient's baseline is not known -Patient had elected not to proceed renal replacement therapy.  No need to monitor renal function.  Anemia, likely multifactorial, including acute blood loss anemia: Patient is not comfort measures.   No further H/H checks.    DVT prophylaxis: SCD Code Status: DO NOT RESUSCITATE Family Communication: Patient's daughter, Theodore Demark Disposition Plan: Hospice house when a bed  becomes available.  Hospice team is directing disposition.    Consultants:   GI  Hospice team  Procedures:   None  Antimicrobials:   None   Subjective: No new complaints Continues to have rectal bleed No chest pain  Objective: Vitals:   09/20/18 2339 09/21/18 0341 09/21/18 0406 09/21/18 0907  BP: (!) 153/76 (!) 155/82 (!) 157/76 (!) 179/86  Pulse: 86 83 81 87  Resp:    18  Temp: 97.7 F (36.5 C) 98.2 F (36.8 C) 98.4 F (36.9 C) 98.3 F (36.8 C)  TempSrc: Oral Oral Oral Oral  SpO2: 99% 100% 98% 100%  Weight:      Height:        Intake/Output Summary (Last 24 hours) at 09/21/2018 1614 Last data filed at 09/21/2018 1005 Gross per 24 hour  Intake 1689.99 ml  Output 800 ml  Net 889.99 ml   Filed Weights   09/18/18 1959 09/19/18 2038  Weight: 63 kg 63.1 kg    Examination:  General exam: Appears calm and comfortable.  Pale.  Dry buccal mucosa Respiratory system: Decreased air entry globally.   Cardiovascular system: S1 & S2 heard. No pedal edema. Gastrointestinal system: Abdomen is nondistended, soft and nontender. No organomegaly or masses felt. Normal bowel sounds heard. Central nervous system: Alert and oriented. No focal neurological deficits. Extremities: No edema  Data Reviewed: I have personally reviewed following labs and imaging studies  CBC: Recent Labs  Lab 09/18/18 1531  09/19/18 0631  09/20/18 0504 09/20/18 1302 09/20/18 2031 09/21/18 0950 09/21/18 1248  WBC 8.5  --   --   --   --   --   --   --   --  NEUTROABS 7.2  --   --   --   --   --   --   --   --   HGB 5.9*   < > 7.2*   < > 11.1* 7.2* 6.7* 9.6* 8.1*  HCT 20.2*  --  23.0*  --  35.5*  --   --   --  26.4*  MCV 90.2  --   --   --   --   --   --   --   --   PLT 143*  --   --   --   --   --   --   --   --    < > = values in this interval not displayed.   Basic Metabolic Panel: Recent Labs  Lab 09/18/18 1531  NA 141  K 4.9  CL 114*  CO2 10*  GLUCOSE 160*  BUN 136*    CREATININE 8.36*  CALCIUM 8.8*   GFR: Estimated Creatinine Clearance: 5.6 mL/min (A) (by C-G formula based on SCr of 8.36 mg/dL (H)). Liver Function Tests: Recent Labs  Lab 09/18/18 1531  AST 29  ALT 15  ALKPHOS 22*  BILITOT 0.5  PROT 5.8*  ALBUMIN 3.2*   No results for input(s): LIPASE, AMYLASE in the last 168 hours. No results for input(s): AMMONIA in the last 168 hours. Coagulation Profile: Recent Labs  Lab 09/18/18 1531  INR 1.40   Cardiac Enzymes: No results for input(s): CKTOTAL, CKMB, CKMBINDEX, TROPONINI in the last 168 hours. BNP (last 3 results) No results for input(s): PROBNP in the last 8760 hours. HbA1C: No results for input(s): HGBA1C in the last 72 hours. CBG: No results for input(s): GLUCAP in the last 168 hours. Lipid Profile: No results for input(s): CHOL, HDL, LDLCALC, TRIG, CHOLHDL, LDLDIRECT in the last 72 hours. Thyroid Function Tests: No results for input(s): TSH, T4TOTAL, FREET4, T3FREE, THYROIDAB in the last 72 hours. Anemia Panel: No results for input(s): VITAMINB12, FOLATE, FERRITIN, TIBC, IRON, RETICCTPCT in the last 72 hours. Urine analysis: No results found for: COLORURINE, APPEARANCEUR, LABSPEC, PHURINE, GLUCOSEU, HGBUR, BILIRUBINUR, KETONESUR, PROTEINUR, UROBILINOGEN, NITRITE, LEUKOCYTESUR Sepsis Labs: @LABRCNTIP (procalcitonin:4,lacticidven:4)  )No results found for this or any previous visit (from the past 240 hour(s)).       Radiology Studies: No results found.      Scheduled Meds: Continuous Infusions: . sodium chloride       LOS: 3 days    Time spent: 25 minutes.    Berton Mount, MD  [facility] Pager #: [phone] 7PM-7AM contact night coverage as above

## 2018-09-21 NOTE — Progress Notes (Signed)
Pt had 3 medium-large size BRBPR; Dr. Dartha Lodge notified, family at bedside. Will continue to monitor.

## 2018-09-22 DIAGNOSIS — K922 Gastrointestinal hemorrhage, unspecified: Secondary | ICD-10-CM

## 2018-09-22 LAB — TYPE AND SCREEN
ABO/RH(D): O NEG
ANTIBODY SCREEN: NEGATIVE
UNIT DIVISION: 0
UNIT DIVISION: 0
UNIT DIVISION: 0
UNIT DIVISION: 0
Unit division: 0
Unit division: 0

## 2018-09-22 LAB — BPAM RBC
BLOOD PRODUCT EXPIRATION DATE: 201912112359
Blood Product Expiration Date: 201911162359
Blood Product Expiration Date: 201912092359
Blood Product Expiration Date: 201912092359
Blood Product Expiration Date: 201912112359
Blood Product Expiration Date: 201912112359
ISSUE DATE / TIME: 201911081704
ISSUE DATE / TIME: 201911092207
ISSUE DATE / TIME: 201911110348
ISSUE DATE / TIME: 201911090002
ISSUE DATE / TIME: 201911102314
UNIT TYPE AND RH: 5100
UNIT TYPE AND RH: 5100
Unit Type and Rh: 5100
Unit Type and Rh: 5100
Unit Type and Rh: 5100
Unit Type and Rh: 5100

## 2018-09-22 MED ORDER — ACETAMINOPHEN 325 MG PO TABS
650.0000 mg | ORAL_TABLET | Freq: Three times a day (TID) | ORAL | Status: DC
  Administered 2018-09-22 – 2018-09-23 (×3): 650 mg via ORAL
  Filled 2018-09-22 (×3): qty 2

## 2018-09-22 MED ORDER — HYDROMORPHONE HCL 1 MG/ML IJ SOLN
0.5000 mg | INTRAMUSCULAR | Status: DC | PRN
  Administered 2018-09-22: 0.5 mg via INTRAVENOUS
  Filled 2018-09-22: qty 1

## 2018-09-22 NOTE — Progress Notes (Addendum)
[facility] [place]  [facility], not today as I previously reported. My apologies for my miscommunication. Spoke with CSW, daughter Rene KocherRegina and RN Lillia AbedLindsay to make them aware.    [facility]. Paper work completed with patient and daughter Rene KocherRegina.   [facility]   RN please call report to [phone].  Thank you,  Forrestine Himva Davis, LCSW [phone]

## 2018-09-22 NOTE — Plan of Care (Signed)
  Problem: Education: Goal: Knowledge of General Education information will improve Description Including pain rating scale, medication(s)/side effects and non-pharmacologic comfort measures Outcome: Progressing   Problem: Health Behavior/Discharge Planning: Goal: Ability to manage health-related needs will improve Outcome: Progressing   Problem: Clinical Measurements: Goal: Ability to maintain clinical measurements within normal limits will improve Outcome: Progressing Goal: Will remain free from infection Outcome: Progressing Goal: Diagnostic test results will improve Outcome: Progressing Goal: Respiratory complications will improve Outcome: Progressing Goal: Cardiovascular complication will be avoided Outcome: Progressing   Problem: Activity: Goal: Risk for activity intolerance will decrease Outcome: Progressing   Problem: Nutrition: Goal: Adequate nutrition will be maintained Outcome: Progressing   Problem: Coping: Goal: Level of anxiety will decrease Outcome: Progressing   Problem: Elimination: Goal: Will not experience complications related to bowel motility Outcome: Progressing Goal: Will not experience complications related to urinary retention Outcome: Progressing   Problem: Pain Managment: Goal: General experience of comfort will improve Outcome: Progressing   Problem: Safety: Goal: Ability to remain free from injury will improve Outcome: Progressing   Problem: Skin Integrity: Goal: Risk for impaired skin integrity will decrease Outcome: Progressing  Sonny MastersRebecca C Kemiya Batdorf, RN 09/22/2018

## 2018-09-22 NOTE — Social Work (Addendum)
CSW aware that there [facility] per liaison Carley Hammedva, will continue to follow. Aware that per CSW Vanessa's conversation with pt daughter that it is not feasible for family to travel to another hospice home. Preference remains with [facility], will reapproach conversation should [facility] to [facility].  11:57am- Update from Carley Hammedva, [facility]- she is working on setting up a time to do paperwork with pt daughter. Will inform MD. DNR on chart to be signed.  Octavio GravesIsabel Thorne Wirz, MSW, [facility] [place] Clinical Social Work [phone]

## 2018-09-22 NOTE — Progress Notes (Signed)
   09/22/18 0953  Clinical Encounter Type  Visited With Patient and family together  Visit Type Initial  Referral From Palliative care team  Consult/Referral To Chaplain  The chaplain responded to spiritual care consult from [facility].  The chaplain was warmly greeted by the Aaron Ward. and Aaron Ward. daughter-Aaron Ward. The time sharing stories reflected the peace the Aaron Ward. has in his life story and the peace he has at EOL.  The time together ended in prayer together and the recognition God will always be with the Aaron Ward.  The chaplain shared with the Aaron Ward. and daughter how to reach the chaplain for future spiritual care.

## 2018-09-22 NOTE — Progress Notes (Signed)
PROGRESS NOTE    Aaron Ward  ZOX:[id number] DOB: 02-21-1929 DOA: 09/18/2018 PCP: Corine Shelter, MD  Brief Narrative:  84 year old with past medical history relevant for ESRD but refused dialysis, dementia, gout, hypertension, hyperlipidemia who presented with acute lower GI bleeding and has decided to transition to hospice.   Assessment & Plan:   Active Problems:   GI bleed   Acute GI bleeding   Palliative care encounter   #) hospice/end-of-life: -Continue opiates for pain -Palliative care following, pending placement [facility] -Not interested in any invasive work-up or any lab checks  #) ESRD: Last creatinine was 8.36.  Patient is apparently refused dialysis as an outpatient. -Palliative care above  #) Hypertension/hyperlipidemia: - Hold HCTZ -Hold rosuvastatin -Hold hydralazine  #) Gout: -Hold allopurinol  P.o. ad lib. Elect lites: No checks Nutrition: P.o. ad lib.  Prophylaxis: None  Disposition: Pending discharge to [facility] this is the only place that are once  DO NOT RESUSCITATE    Consultants:   [facility] gastroenterology  Palliative care  Procedures:  None  Antimicrobials:   None   Subjective: This morning patient reports he is doing fairly well over the past 2 hours he has been complaining of feeling stiff.  This per his daughter is his equivalent of referring to pain.  He denies any further bowel movements or rectal bleeding.  He denies any chest pain, cough, congestion, rhinorrhea, anxiety, depression.  Objective: Vitals:   09/21/18 0341 09/21/18 0406 09/21/18 0907 09/22/18 0729  BP: (!) 155/82 (!) 157/76 (!) 179/86 (!) 147/78  Pulse: 83 81 87 (!) 107  Resp:   18 17  Temp: 98.2 F (36.8 C) 98.4 F (36.9 C) 98.3 F (36.8 C) 98.3 F (36.8 C)  TempSrc: Oral Oral Oral Oral  SpO2: 100% 98% 100% 100%  Weight:      Height:        Intake/Output Summary (Last 24 hours) at 09/22/2018 1103 Last data filed at 09/22/2018  0900 Gross per 24 hour  Intake 270.34 ml  Output 250 ml  Net 20.34 ml   Filed Weights   09/18/18 1959 09/19/18 2038  Weight: 63 kg 63.1 kg    Examination:  General exam: Appears calm and comfortable  Respiratory system: Clear to auscultation. Respiratory effort normal. Cardiovascular system: Regular rate and rhythm, no murmurs Gastrointestinal system: Abdomen is nondistended, soft and nontender. No organomegaly or masses felt. Normal bowel sounds heard. Central nervous system: Alert and oriented.  Grossly intact, moving all extremities Extremities: No lower extremity edema. Skin: No rashes over visible skin Psychiatry: Judgement and insight appear normal. Mood & affect appropriate.     Data Reviewed: I have personally reviewed following labs and imaging studies  CBC: Recent Labs  Lab 09/18/18 1531  09/19/18 0631  09/20/18 0504 09/20/18 1302 09/20/18 2031 09/21/18 0950 09/21/18 1248  WBC 8.5  --   --   --   --   --   --   --   --   NEUTROABS 7.2  --   --   --   --   --   --   --   --   HGB 5.9*   < > 7.2*   < > 11.1* 7.2* 6.7* 9.6* 8.1*  HCT 20.2*  --  23.0*  --  35.5*  --   --   --  26.4*  MCV 90.2  --   --   --   --   --   --   --   --  PLT 143*  --   --   --   --   --   --   --   --    < > = values in this interval not displayed.   Basic Metabolic Panel: Recent Labs  Lab 09/18/18 1531  NA 141  K 4.9  CL 114*  CO2 10*  GLUCOSE 160*  BUN 136*  CREATININE 8.36*  CALCIUM 8.8*   GFR: Estimated Creatinine Clearance: 5.6 mL/min (A) (by C-G formula based on SCr of 8.36 mg/dL (H)). Liver Function Tests: Recent Labs  Lab 09/18/18 1531  AST 29  ALT 15  ALKPHOS 22*  BILITOT 0.5  PROT 5.8*  ALBUMIN 3.2*   No results for input(s): LIPASE, AMYLASE in the last 168 hours. No results for input(s): AMMONIA in the last 168 hours. Coagulation Profile: Recent Labs  Lab 09/18/18 1531  INR 1.40   Cardiac Enzymes: No results for input(s): CKTOTAL, CKMB,  CKMBINDEX, TROPONINI in the last 168 hours. BNP (last 3 results) No results for input(s): PROBNP in the last 8760 hours. HbA1C: No results for input(s): HGBA1C in the last 72 hours. CBG: No results for input(s): GLUCAP in the last 168 hours. Lipid Profile: No results for input(s): CHOL, HDL, LDLCALC, TRIG, CHOLHDL, LDLDIRECT in the last 72 hours. Thyroid Function Tests: No results for input(s): TSH, T4TOTAL, FREET4, T3FREE, THYROIDAB in the last 72 hours. Anemia Panel: No results for input(s): VITAMINB12, FOLATE, FERRITIN, TIBC, IRON, RETICCTPCT in the last 72 hours. Sepsis Labs: No results for input(s): PROCALCITON, LATICACIDVEN in the last 168 hours.  No results found for this or any previous visit (from the past 240 hour(s)).       Radiology Studies: No results found.      Scheduled Meds: . acetaminophen  650 mg Oral Q8H   Continuous Infusions: . sodium chloride 5 mL/hr at 09/22/18 0400     LOS: 4 days    Time spent: 35    Delaine LameShrey C Basia Mcginty, MD [facility]  If 7PM-7AM, please contact night-coverage [email or web address] Password [email or web address] 09/22/2018, 11:03 AM

## 2018-09-22 NOTE — Discharge Summary (Addendum)
Physician Discharge Summary  Arley Phenixhomas H Nickolas ZOX:[id number] DOB: 02-17-1929 DOA: 09/18/2018  PCP: Corine ShelterKilpatrick, George, MD  Admit date: 09/18/2018 Discharge date: 09/23/2018  Addendum 09/23/2018: Patient could not be discharged to hospice yesterday.  He is being discharged today.  He was otherwise stable for discharge.  Admitted From: Home Disposition:  Residential Hospice  Recommendations for Outpatient Follow-up:  1. Please ask your hospice provider for any medications.  Home Health: No Equipment/Devices:None  Discharge Condition: hospice CODE STATUS: Comfort Care Diet recommendation: Regular   Brief/Interim Summary:  #) Hospice/end-of-life: Patient was admitted with GI bleed.  During this hospitalization he opted to transition to palliative care only and is not interested in any further invasive work-up.  He was discharged to be can place.  He is to receive opiates for pain and shortness of breath, benzodiazepines for anxiety, anticholinergics for secretions, antipsychotics for agitation.  #) ESRD: Patient has a known history of ESRD but has refused dialysis.  #) Acute GI bleed: Patient was admitted with lower GI bleed.  He received 2 units packed red blood cells.  His GI bleed spontaneously resolved.  Gastrology was consulted and the patient opted to transition to hospice/end-of-life care as he is noticed in any invasive work-ups.  #) Hypertension/hyperlipidemia: Patient's home HCTZ, rosuvastatin, hydralazine were discontinued.  #) Gout: Patient's allopurinol was discontinued.  Discharge Diagnoses:  Active Problems:   GI bleed   Acute GI bleeding   Palliative care encounter    Discharge Instructions  Discharge Instructions    Call MD for:  persistant nausea and vomiting   Complete by:  As directed    Call MD for:  severe uncontrolled pain   Complete by:  As directed    Diet - low sodium heart healthy   Complete by:  As directed    Discharge instructions   Complete by:   As directed    Please ask your hospice provider for any medications for pain and SOB.   Increase activity slowly   Complete by:  As directed      Allergies as of 09/23/2018   No Known Allergies     Medication List    TAKE these medications   acetaminophen 500 MG tablet Commonly known as:  TYLENOL Take 500 mg by mouth every 6 (six) hours as needed for moderate pain.   allopurinol 100 MG tablet Commonly known as:  ZYLOPRIM Take 100 mg by mouth daily.   fenofibrate 48 MG tablet Commonly known as:  TRICOR Take 48 mg by mouth daily.   GLUCOSAMINE-CALCIUM-VIT D PO Take 1 tablet by mouth daily.   hydrALAZINE 50 MG tablet Commonly known as:  APRESOLINE Take 50 mg by mouth 2 (two) times daily.   hydrochlorothiazide 25 MG tablet Commonly known as:  HYDRODIURIL Take 25 mg by mouth See admin instructions. Take 1 tablet by mouth on Monday, Wednesday, and Friday   HYDROcodone-acetaminophen 5-325 MG tablet Commonly known as:  NORCO/VICODIN Take 1 tablet by mouth every 6 (six) hours as needed for moderate pain.   Omega-3 500 MG Caps Take 1 capsule by mouth daily.   OVER THE COUNTER MEDICATION Apply 1 application topically daily as needed (Pain). Hempvana Pain Cream   rosuvastatin 10 MG tablet Commonly known as:  CRESTOR Take 10 mg by mouth at bedtime.       No Known Allergies  Consultations:  GI  Palliative   Procedures/Studies: Ct Abdomen Pelvis Wo Contrast  Result Date: 09/18/2018 CLINICAL DATA:  Rectal bleeding. EXAM: CT ABDOMEN  AND PELVIS WITHOUT CONTRAST TECHNIQUE: Multidetector CT imaging of the abdomen and pelvis was performed following the standard protocol without IV contrast. COMPARISON:  None. FINDINGS: Lower chest: No acute abnormality. Hepatobiliary: No focal liver abnormality is seen. No gallstones, gallbladder wall thickening, or biliary dilatation. Pancreas: Unremarkable. No pancreatic ductal dilatation or surrounding inflammatory changes. Spleen:  Normal in size without focal abnormality. Adrenals/Urinary Tract: Adrenal glands appear normal. Bilateral renal cysts are noted, including probable hyperdense cyst involving lower pole of left kidney. No hydronephrosis or renal obstruction is noted. No renal or ureteral calculi are noted. Urinary bladder is unremarkable. Stomach/Bowel: Stomach is within normal limits. Appendix appears normal. No evidence of bowel wall thickening, distention, or inflammatory changes. Sigmoid diverticulosis is noted without inflammation. Vascular/Lymphatic: No significant vascular findings are present. No enlarged abdominal or pelvic lymph nodes. Reproductive: Moderate prostatic enlargement is noted. Other: No abdominal wall hernia or abnormality. No abdominopelvic ascites. Musculoskeletal: Severe multilevel degenerative disc disease is noted in the lumbar spine. No acute osseous abnormality is noted. IMPRESSION: Sigmoid diverticulosis without inflammation. Moderate prostatic enlargement. Bilateral renal cysts. No acute abnormality seen in the abdomen or pelvis. Electronically Signed   By: Lupita Raider, M.D.   On: 09/18/2018 19:09      Subjective:   Discharge Exam: Vitals:   09/22/18 0729 09/23/18 0639  BP: (!) 147/78 136/79  Pulse: (!) 107 96  Resp: 17 14  Temp: 98.3 F (36.8 C) 97.6 F (36.4 C)  SpO2: 100% 100%   Vitals:   09/21/18 0406 09/21/18 0907 09/22/18 0729 09/23/18 0639  BP: (!) 157/76 (!) 179/86 (!) 147/78 136/79  Pulse: 81 87 (!) 107 96  Resp:  18 17 14   Temp: 98.4 F (36.9 C) 98.3 F (36.8 C) 98.3 F (36.8 C) 97.6 F (36.4 C)  TempSrc: Oral Oral Oral Oral  SpO2: 98% 100% 100% 100%  Weight:      Height:       General exam: Appears calm and comfortable  Respiratory system: Clear to auscultation. Respiratory effort normal. Cardiovascular system: Regular rate and rhythm, no murmurs Gastrointestinal system: Abdomen is nondistended, soft and nontender. No organomegaly or masses felt.  Normal bowel sounds heard. Central nervous system: Alert and oriented.  Grossly intact, moving all extremities Extremities: No lower extremity edema. Skin: No rashes over visible skin Psychiatry: Judgement and insight appear normal. Mood & affect appropriate.     The results of significant diagnostics from this hospitalization (including imaging, microbiology, ancillary and laboratory) are listed below for reference.     Microbiology: No results found for this or any previous visit (from the past 240 hour(s)).   Labs: BNP (last 3 results) No results for input(s): BNP in the last 8760 hours. Basic Metabolic Panel: Recent Labs  Lab 09/18/18 1531  NA 141  K 4.9  CL 114*  CO2 10*  GLUCOSE 160*  BUN 136*  CREATININE 8.36*  CALCIUM 8.8*   Liver Function Tests: Recent Labs  Lab 09/18/18 1531  AST 29  ALT 15  ALKPHOS 22*  BILITOT 0.5  PROT 5.8*  ALBUMIN 3.2*   No results for input(s): LIPASE, AMYLASE in the last 168 hours. No results for input(s): AMMONIA in the last 168 hours. CBC: Recent Labs  Lab 09/18/18 1531  09/19/18 0631  09/20/18 0504 09/20/18 1302 09/20/18 2031 09/21/18 0950 09/21/18 1248  WBC 8.5  --   --   --   --   --   --   --   --  NEUTROABS 7.2  --   --   --   --   --   --   --   --   HGB 5.9*   < > 7.2*   < > 11.1* 7.2* 6.7* 9.6* 8.1*  HCT 20.2*  --  23.0*  --  35.5*  --   --   --  26.4*  MCV 90.2  --   --   --   --   --   --   --   --   PLT 143*  --   --   --   --   --   --   --   --    < > = values in this interval not displayed.   Cardiac Enzymes: No results for input(s): CKTOTAL, CKMB, CKMBINDEX, TROPONINI in the last 168 hours. BNP: Invalid input(s): POCBNP CBG: No results for input(s): GLUCAP in the last 168 hours. D-Dimer No results for input(s): DDIMER in the last 72 hours. Hgb A1c No results for input(s): HGBA1C in the last 72 hours. Lipid Profile No results for input(s): CHOL, HDL, LDLCALC, TRIG, CHOLHDL, LDLDIRECT in the  last 72 hours. Thyroid function studies No results for input(s): TSH, T4TOTAL, T3FREE, THYROIDAB in the last 72 hours.  Invalid input(s): FREET3 Anemia work up No results for input(s): VITAMINB12, FOLATE, FERRITIN, TIBC, IRON, RETICCTPCT in the last 72 hours. Urinalysis No results found for: COLORURINE, APPEARANCEUR, LABSPEC, PHURINE, GLUCOSEU, HGBUR, BILIRUBINUR, KETONESUR, PROTEINUR, UROBILINOGEN, NITRITE, LEUKOCYTESUR Sepsis Labs Invalid input(s): PROCALCITONIN,  WBC,  LACTICIDVEN Microbiology No results found for this or any previous visit (from the past 240 hour(s)).   Time coordinating discharge: 35  SIGNED:   Delaine Lame, MD  [facility] 09/23/2018, 11:33 AM   If 7PM-7AM, please contact night-coverage [email or web address] Password [email or web address]

## 2018-09-22 NOTE — Social Work (Addendum)
[facility] spoke with [facility]; there was [facility]). Have scheduled [facility] for noon pick up tomorrow should pt remain medically stable for transfer. MD paged.  Octavio GravesIsabel Spiro Ausborn, MSW, [facility] [place] Clinical Social Work [phone]

## 2018-09-22 NOTE — Care Management Important Message (Signed)
Important Message  Patient Details  Name: Aaron Ward MRN: [id number] Date of Birth: 12-07-1928   Medicare Important Message Given:  Yes    Clara Smolen Stefan Church 09/22/2018, 9:17 AM

## 2018-09-22 NOTE — Consult Note (Signed)
            [facility] CM Primary Care Navigator  09/22/2018  Aaron Ward 02/19/1929 [id number]   Attempttosee patientat the bedsideto identify possible discharge needs but plan is for him to discharge to a residential hospice for end of life care.  PerMD note,patientpresented with acute lower GI bleeding and has decided to transition to hospice.  Palliative Care consulted, DNR status confirmed. Anticipated discharge plan is placement at [facility], as that was the patient's request when he was well.   Nofurtheridentifiable [facility] Care Management needsat this point.   For additional questions please contact:  Karin Golden A. Elda Dunkerson, BSN, RN-BC [facility] PRIMARY CARE Navigator Cell: [phone]

## 2018-09-22 NOTE — Plan of Care (Signed)
  Problem: Clinical Measurements: Goal: Quality of life will improve Outcome: Progressing   Problem: Role Relationship: Goal: Family's ability to cope with current situation will improve Outcome: Progressing Goal: Ability to verbalize concerns, feelings, and thoughts to partner or family member will improve Outcome: Progressing   Problem: Pain Management: Goal: Satisfaction with pain management regimen will improve Outcome: Progressing   

## 2018-09-22 NOTE — Progress Notes (Signed)
Palliative Medicine RN Note: Symptom check. Discussed pt with Dr Phillips OdorGolding.  Patient is awake. Daughter is at bedside & reports they are waiting on pain medication. Romeo AppleBen came to give it while I was present. Mr Denny Peonvery relaxed during my visit and reported that the medication is working.  Mr Denny Peonvery is complaining of being "stiff." We discussed the addition of scheduled acetaminophen to prevent this, as well as allowing him to have the Dilaudid more frequently if he needs it.   Obtained orders from Dr Phillips OdorGolding. While I was in the hall, Mr Maiorino's daughter Rene KocherRegina came to talk to me. She reports that her mother and father always used the word "stiff" when they had pain and that Mr Denny Peonvery doesn't like to complain. We agreed that when he complains of feeling stiff, staff will treat it as pain; RN Romeo AppleBen is aware.   There is family distress regarding hospice placement. Rene KocherRegina lost her husband 2 months ago and is about to lose her father. She is only willing to [facility], as that was the patient's request when he was well. Further discussion about changing hospice providers is likely to result in further distress, as well as anger and frustration from both the patient and the family.   I will follow up this afternoon for further symptom checks.  Margret ChanceMelanie G. Aki Burdin, RN, BSN, [facility] Palliative Medicine Team 09/22/2018 10:35 AM Office [phone]

## 2018-09-22 NOTE — Discharge Instructions (Signed)
Hospice Introduction Hospice is a service that is designed to provide people who are terminally ill and their families with medical, spiritual, and psychological support. Its aim is to improve your quality of life by keeping you as alert and comfortable as possible. Who will be my providers when I begin hospice care? Hospice teams often include:  A nurse.  A doctor. The hospice doctor will be available for your care, but you can bring your regular doctor or nurse practitioner.  Social workers.  Religious leaders (such as a Clinical biochemist).  Trained volunteers.  What roles will providers play in my care? Hospice is performed by a team of health care professionals and volunteers who:  Help keep you comfortable: ? Hospice can be provided in your home or in a homelike setting. ? The hospice staff works with your family and friends to help meet your needs. ? You will enjoy the support of loved ones by receiving much of your basic care from family and friends.  Provide pain relief and manage your symptoms. The staff supply all necessary medicines and equipment.  Provide companionship when you are alone.  Allow you and your family to rest. They may do light housekeeping, prepare meals, and run errands.  Provide counseling. They will make sure your emotional, spiritual, and social needs and those of your family are being met.  Provide spiritual care: ? Spiritual care will be individualized to meet your needs and your family's needs. ? Spiritual care may involve:  Helping you look at what death means to you.  Helping you say goodbye to your family and friends.  Performing a specific religious ceremony or ritual.  When should hospice care begin? Most people who use hospice are believed to have fewer than 6 months to live.  Your family and health care providers can help you decide when hospice services should begin.  If your condition improves, you may discontinue the program.  What  should I consider before selecting a program? Most hospice programs are run by nonprofit, independent organizations. Some are affiliated with hospitals, nursing homes, or home health care agencies. Hospice programs can take place in the home or at a hospice center, hospital, or skilled nursing facility. When choosing a hospice program, ask the following questions:  What services are available to me?  What services will be offered to my loved ones?  How involved will my loved ones be?  How involved will my health care provider be?  Who makes up the hospice care team? How are they trained or screened?  How will my pain and symptoms be managed?  If my circumstances change, can the services be provided in a different setting, such as my home or in the hospital?  Is the program reviewed and licensed by the state or certified in some other way?  Where can I learn more about hospice? You can learn about existing hospice programs in your area from your health care providers. You can also read more about hospice online. The websites of the following organizations contain helpful information:  The [facility] and Palliative Care Organization [facility]).  The Hospice Association of America ([place]).  The [place].  The American Cancer Society (ACS).  Hospice Net.  This information is not intended to replace advice given to you by your health care provider. Make sure you discuss any questions you have with your health care provider. Document Released: 02/14/2004 Document Revised: 06/13/2016 Document Reviewed: 09/07/2013 Elsevier Interactive Patient Education  2017 [employer].

## 2018-09-22 NOTE — Progress Notes (Addendum)
Pt. arrived to the unit from 67M. Alert and oriented x4. Pt. Currently reports no pain.  Pt. has family at the bedside. Pt. oriented to room and call bell.  Sonny Mastersebecca C Avian Greenawalt, RN 09/22/2018

## 2018-09-23 NOTE — Progress Notes (Signed)
[facility]. Patient's daughter Rene KocherRegina at the bedside. Report called to Okey Regalarol, Charity fundraiserN at [facility]. AVS provided to [facility] transport.

## 2018-09-23 NOTE — Social Work (Signed)
[facility] was set up on 11/12 for today at noon.  Clinical Social Worker facilitated patient discharge including contacting patient family and facility to confirm patient discharge plans.  Clinical information faxed to facility and family agreeable with plan.  CS[facility]  RN to call [phone] with report prior to discharge.  Clinical Social Worker will sign off for now as social work intervention is no longer needed. Please consult us again if new need arises.  Aaron HutchingIsabel H Jeraline Ward, [place] Clinical Social Worker [phone]

## 2018-10-11 DEATH — deceased

## 2020-06-09 IMAGING — CT CT ABD-PELV W/O CM
2 of 4 series · 16 of 46 positions shown, 18 images · non-contrast
Comparison: None.

CLINICAL DATA: Rectal bleeding.

EXAM:
CT ABDOMEN AND PELVIS WITHOUT CONTRAST
TECHNIQUE: Multidetector CT imaging of the abdomen and pelvis was performed
following the standard protocol without IV contrast.

[Series 3: abd/ pelvis 5.0 i30f 2 · axial · 0.77mm/px · z∈[+1044,+1459]mm · 13 of 91 slices shown, 15 images]
[im 4/91  soft-tissue]
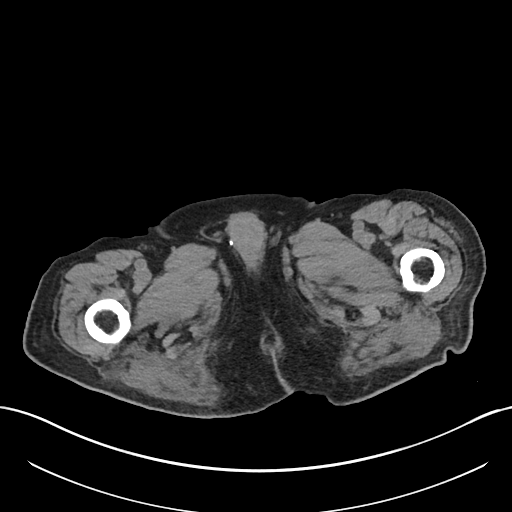
[im 4/91  bone]
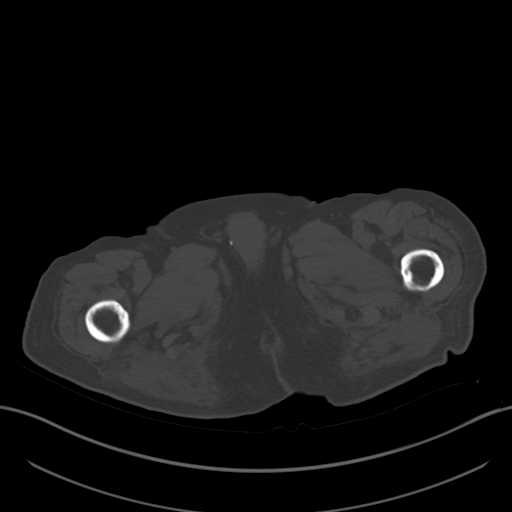
[im 12/91  soft-tissue]
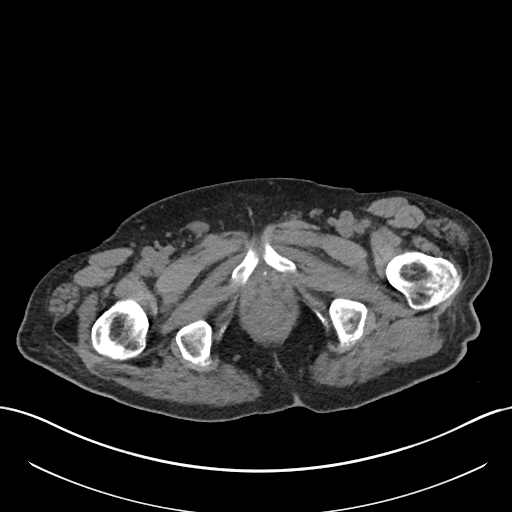
[im 19/91  soft-tissue]
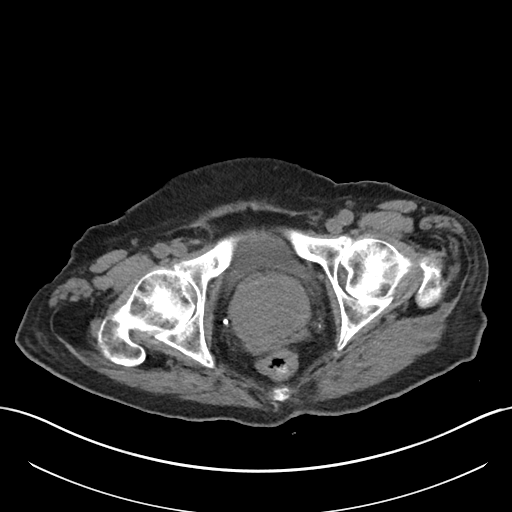
[im 27/91  soft-tissue]
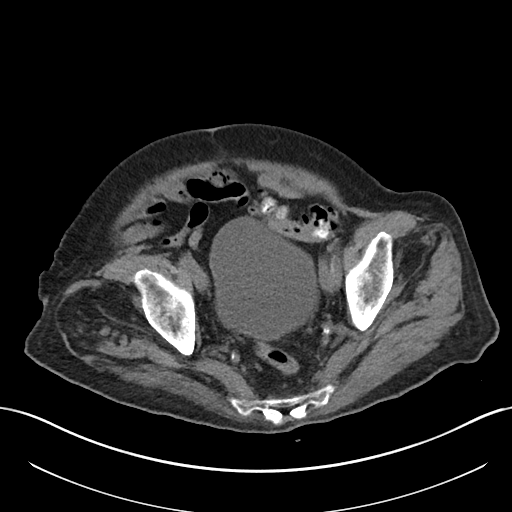
[im 31/91  soft-tissue]
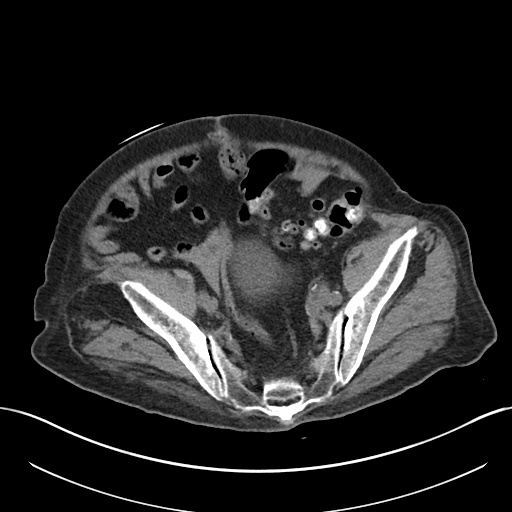
[im 38/91  soft-tissue]
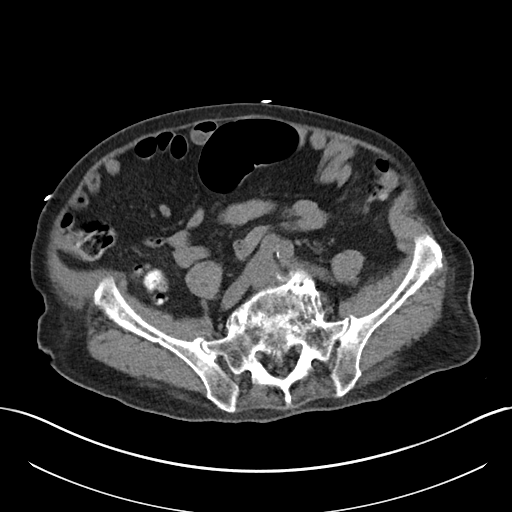
[im 46/91  soft-tissue]
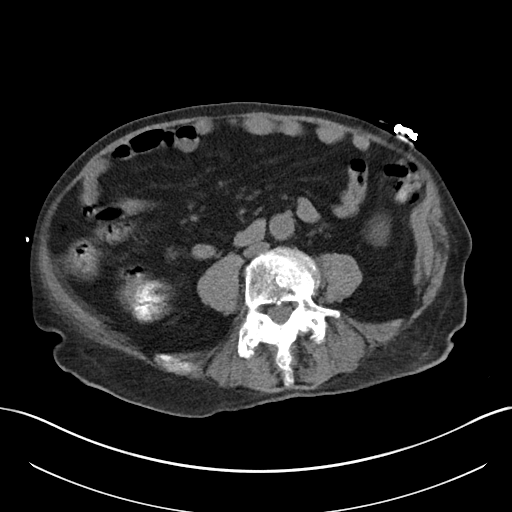
[im 53/91  soft-tissue]
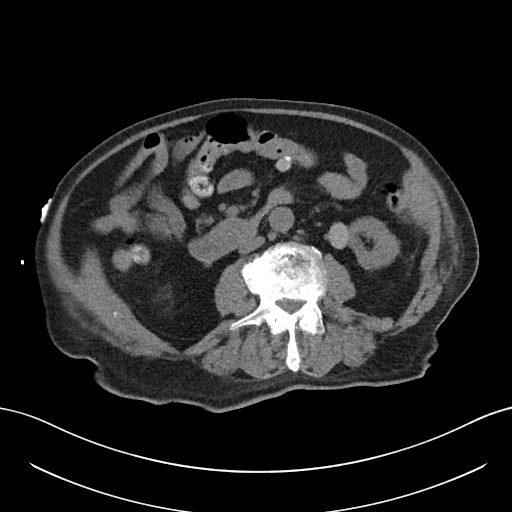
[im 61/91  soft-tissue]
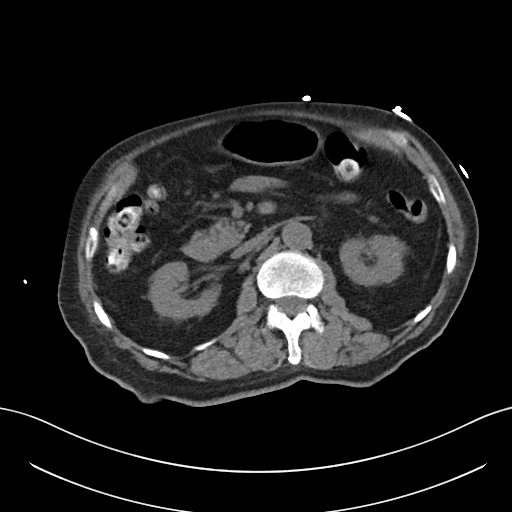
[im 61/91  bone]
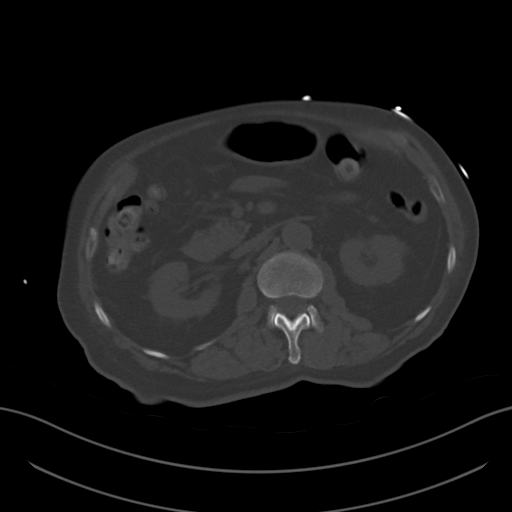
[im 64/91  soft-tissue]
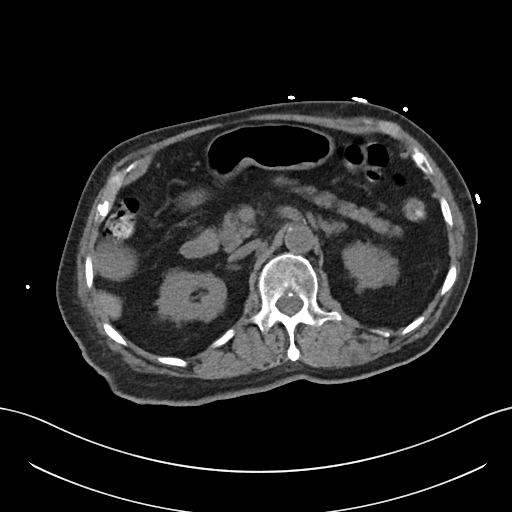
[im 72/91  soft-tissue]
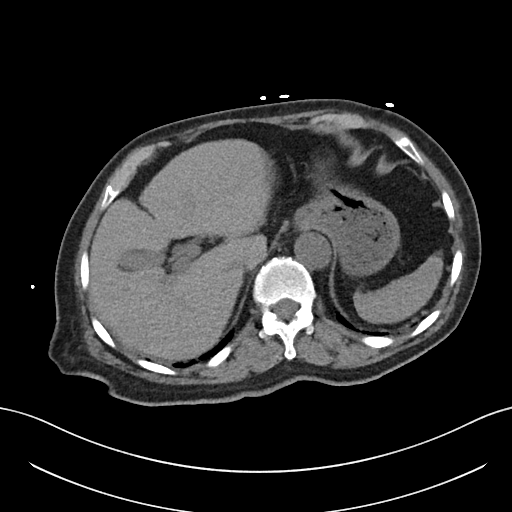
[im 79/91  soft-tissue]
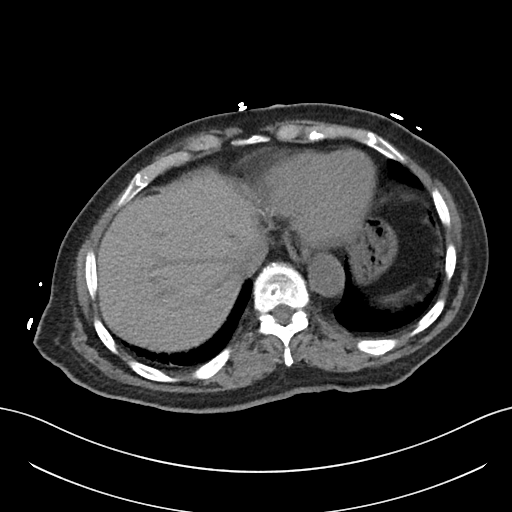
[im 87/91  soft-tissue]
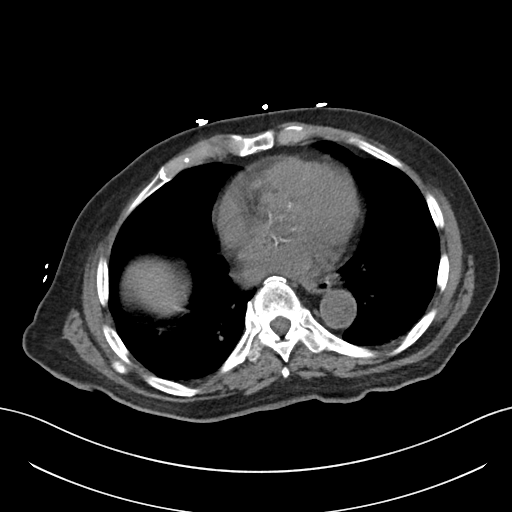

[Series 6: cor st · coronal · 0.73mm/px · 3 of 99 slices shown]
[im 33/99  soft-tissue]
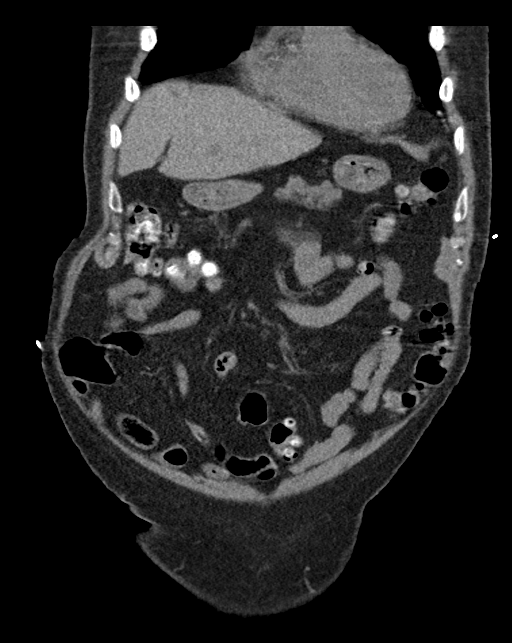
[im 44/99  soft-tissue]
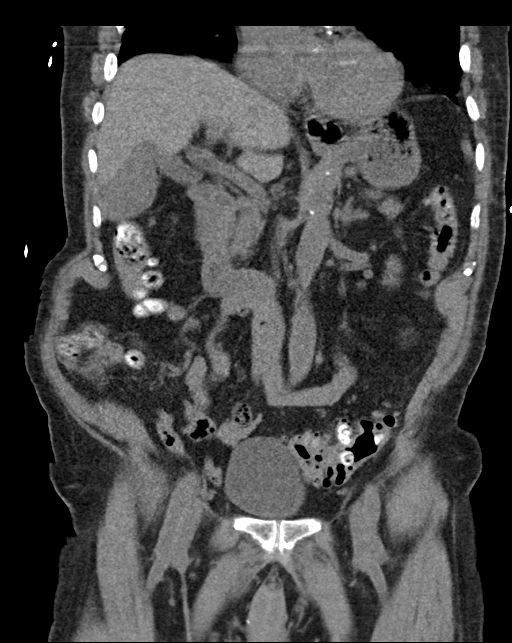
[im 55/99  soft-tissue]
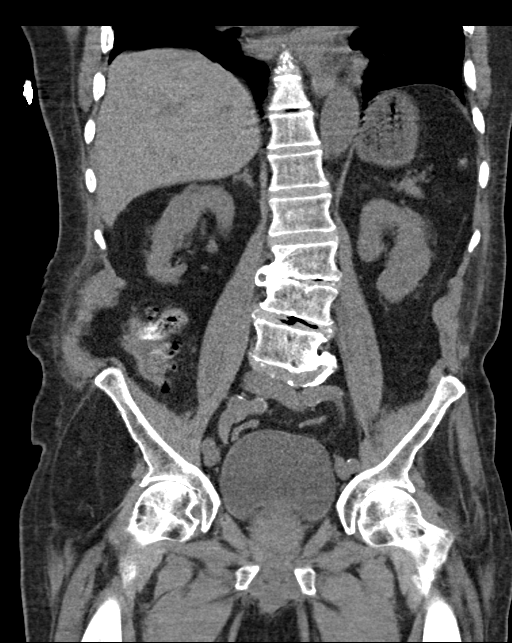

[16 of 46 positions shown; findings below may reference images not displayed]

FINDINGS: Lower chest: No acute abnormality.

Hepatobiliary: No focal liver abnormality is seen. No gallstones,
gallbladder wall thickening, or biliary dilatation.

Pancreas: Unremarkable. No pancreatic ductal dilatation or
surrounding inflammatory changes.

Spleen: Normal in size without focal abnormality.

Adrenals/Urinary Tract: Adrenal glands appear normal. Bilateral
renal cysts are noted, including probable hyperdense cyst involving
lower pole of left kidney. No hydronephrosis or renal obstruction is
noted. No renal or ureteral calculi are noted. Urinary bladder is
unremarkable.

Stomach/Bowel: Stomach is within normal limits. Appendix appears
normal. No evidence of bowel wall thickening, distention, or
inflammatory changes. Sigmoid diverticulosis is noted without
inflammation.

Vascular/Lymphatic: No significant vascular findings are present. No
enlarged abdominal or pelvic lymph nodes.

Reproductive: Moderate prostatic enlargement is noted.

Other: No abdominal wall hernia or abnormality. No abdominopelvic
ascites.

Musculoskeletal: Severe multilevel degenerative disc disease is
noted in the lumbar spine. No acute osseous abnormality is noted.
IMPRESSION: Sigmoid diverticulosis without inflammation.

Moderate prostatic enlargement.

Bilateral renal cysts.

No acute abnormality seen in the abdomen or pelvis.
